# Patient Record
Sex: Male | Born: 1940 | Race: White | Hispanic: No | Marital: Single | Smoking: Current every day smoker
Health system: Southern US, Community
[De-identification: ages and names within clinical notes are randomized; demographics above are authoritative.]

## PROBLEM LIST (undated history)

## (undated) DIAGNOSIS — I219 Acute myocardial infarction, unspecified: Secondary | ICD-10-CM

## (undated) DIAGNOSIS — E7800 Pure hypercholesterolemia, unspecified: Secondary | ICD-10-CM

## (undated) DIAGNOSIS — I639 Cerebral infarction, unspecified: Secondary | ICD-10-CM

## (undated) DIAGNOSIS — R569 Unspecified convulsions: Secondary | ICD-10-CM

## (undated) DIAGNOSIS — H5440 Blindness, one eye, unspecified eye: Secondary | ICD-10-CM

## (undated) DIAGNOSIS — I10 Essential (primary) hypertension: Secondary | ICD-10-CM

## (undated) DIAGNOSIS — I4891 Unspecified atrial fibrillation: Secondary | ICD-10-CM

## (undated) HISTORY — PX: HERNIA REPAIR: SHX51

---

## 2003-09-27 ENCOUNTER — Emergency Department (HOSPITAL_COMMUNITY): Admission: EM | Admit: 2003-09-27 | Discharge: 2003-09-27 | Payer: Self-pay | Admitting: Emergency Medicine

## 2004-09-11 DIAGNOSIS — I639 Cerebral infarction, unspecified: Secondary | ICD-10-CM

## 2004-09-11 HISTORY — DX: Cerebral infarction, unspecified: I63.9

## 2006-11-08 ENCOUNTER — Ambulatory Visit: Payer: Self-pay | Admitting: Cardiology

## 2006-11-08 ENCOUNTER — Observation Stay (HOSPITAL_COMMUNITY): Admission: EM | Admit: 2006-11-08 | Discharge: 2006-11-10 | Payer: Self-pay | Admitting: Emergency Medicine

## 2006-11-09 ENCOUNTER — Encounter: Payer: Self-pay | Admitting: Cardiology

## 2008-09-29 ENCOUNTER — Encounter: Admission: RE | Admit: 2008-09-29 | Discharge: 2008-09-29 | Payer: Self-pay | Admitting: Family Medicine

## 2008-10-16 ENCOUNTER — Ambulatory Visit (HOSPITAL_COMMUNITY): Admission: RE | Admit: 2008-10-16 | Discharge: 2008-10-16 | Payer: Self-pay | Admitting: Neurosurgery

## 2011-01-27 NOTE — Procedures (Signed)
EEG NUMBER:  [id number].   CLINICAL HISTORY:  The patient is a 70 year old with new onset of  seizures.  Study is being done to look for presence of seizure activity,  780.39.   PROCEDURE:  The tracing is carried out on a 32-channel digital Cadwell  recorder reformatted into 16-channel montages with 1 devoted to EKG.  The patient was awake during the recording.  The International 10/20  system of lead placement was used.   DESCRIPTION OF FINDINGS:  Dominant frequency is a 6 to 7 Hz less than 10  microvolt activity that is broadly distributed.  Frontally predominant  beta range activity of under 10 microvolts was also seen.  Toward the  end of the record, an 8 Hz alpha range activity was seen centrally and  occasionally posteriorly but was fragmentary.  There was no focal  slowing.  There was no interictal epileptiform activity in the form of  spikes or sharp waves.   EKG showed an atrial-fibrillation pattern with ventricular response of  60 beats per minute.   IMPRESSION:  Abnormal EEG on the basis of mild diffuse background  slowing.  This could very well be related to postictal state.  Other  etiologies are possible and would need to be correlated with the  patient's clinical context.  There is no evidence of focality or  seizures in this record.      Deanna Artis. Sharene Skeans, M.D.  Electronically Signed     ZOX:WRUE  D:  11/10/2006 12:20:28  T:  11/10/2006 14:49:46  Job #:  [id number]   cc:   Melvyn Novas, M.D.  Fax: [phone]

## 2011-01-27 NOTE — Consult Note (Signed)
NAME:  Kyle [name], Kyle [name] NO.:  [id number]   MEDICAL RECORD NO.:  [id number]          PATIENT TYPE:  INP   LOCATION:  6524                         FACILITY:  [facility]   PHYSICIAN:  Melvyn Novas, M.D.  DATE OF BIRTH:  1941-05-07   DATE OF CONSULTATION:  11/09/2006  DATE OF DISCHARGE:                                 CONSULTATION   HISTORY:  This is a 70 year old Caucasian right-handed male who had  suffered a full generalized tonic-clonic seizure yesterday witnessed by  a family member that stated that it might have lasted as long as 5  minutes.  He became prolonged postictal.  In the ER he received 1 gram  of fosphenytoin.  He never had a second seizure during this  hospitalization but he remained prolonged drowsy.   PAST MEDICAL HISTORY:  His past medical history is positive for history  of atrial fibrillation and leukemia.  The patient is coumadinized.  His  last outside INR was 2.3.  He is followed by a cardiologist in [place]  which I believe he named Dr. Elroy Channel.  He has suffered in 2006 at the time  he was diagnosed with leukemia, a left parietal stroke.  This is seen by  today's CT but it shows no bleed in spite of his coumadinization.  He  also has a history of a MI in 1990, continued coronary artery disease,  mild emphysema.   REVIEW OF SYSTEMS:  He is in no acute distress.  He feels sore and achy.  He has no visible tongue bite.  His speech is clear.  He is amnestic for  the event.  He does still have trouble remembering his doctor's name and  where his medical care took place.   PHYSICAL EVALUATION:  VITAL SIGNS:  Shows stable vital signs.  The  patient has a history of A fib.  He has a blood pressure of 128/76,  heart rate of 88, respiratory rate of 10.  Lungs were clear to  auscultation.  The patient continued Coumadin at 2.5 mg daily for his  atrial fibrillation.  There is also hyperlipidemia and a chest wall Port-  A-Cath for leukemia chemotherapy  treatments noted.  The patient has a  congenital right eye ptosis and an inguinal hernia.  Cranial nerves:  Mental status:  The patient is now alert but he is not  fully oriented.  He is oriented to person and place but has memory  deficits.  He is however pretty well able to concentrate.  He can repeat  three items.  He can count for me, spell, give me his birth date, etc.  Pupils react equal to light.  He has full extraocular movements but he  has a right-sided ptosis that he claims is congenital.  There is no  other facial asymmetry.  No sensory loss.  Tongue and uvula are midline  without any evidence of bite mark.  The neck is supple.  There is no  carotid bruit.  There is a regular pulse now.  Periphery vision is  intact and the patient states that he suffered a  right quadrant anopsia  to the lower quadrant on the right.  By finger perimetry this was  possibly mixed.  He has no extremity weakness but he has a preexisting  increased tone in the right arm.  I did not see a pronator drift and  finger-nose test was intact.  He has equal deep tendon reflexes, no  Babinski.  He sits up without assistance.  Gait was deferred as the  patient is currently receiving IV drugs.   LABORATORY RESULTS:  White blood cell count of 5.4, hemoglobin 12.8,  hematocrit of 37.6 and the patient has a platelet count of 88,000 which  is low.  An INR here was 2.1, prothrombin time was 24, sodium 134,  potassium 4.4 and glucose 121, BUN 15, creatinine 1.28.  SGOT 25, GPT  21.   ASSESSMENT:  New onset seizure in a patient with a history of stroke,  atrial fibrillation and leukemia.   PLAN:  Since there was no bleed by CT and the patient is on Coumadin, I  would like to check an MRI today to rule out a leukemic infiltrate or a  smaller bleed as well as new ischemic changes that could be embolic  given the history of atrial fibrillation.  An EEG is also to follow  today.  I would presume that discharge for  this patient could be taken place  this afternoon or tomorrow morning.  I would prefer to discharge this  patient on Keppra 500 mg b.i.d. unless his oncologist would object.  Follow-up with me [facility] in 4-6 weeks.  Our office number is [phone].  Keppra does not need blood level checked.      Melvyn Novas, M.D.  Electronically Signed     CD/MEDQ  D:  11/09/2006  T:  11/09/2006  Job:  [id number]   cc:   Elliot Cousin, M.D.

## 2011-01-27 NOTE — Discharge Summary (Signed)
NAMERHODERICK, FARREL               ACCOUNT NO.:  [id number]   MEDICAL RECORD NO.:  [id number]          PATIENT TYPE:  INP   LOCATION:  6524                         FACILITY:  [facility]   PHYSICIAN:  Wilson Singer, M.D.DATE OF BIRTH:  1940/11/28   DATE OF ADMISSION:  11/08/2006  DATE OF DISCHARGE:  11/10/2006                               DISCHARGE SUMMARY   FINAL DISCHARGE DIAGNOSES:  1. Seizure.  2. Left brain stroke 2003.  3. Chronic atrial fibrillation on chronic Coumadin therapy.  4. Coronary artery disease with myocardial infarction in 1990.  5. Hyperlipidemia.  6. 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   MEDICATIONS ON DISCHARGE:  1. Keppra 500 mg b.i.d.  2. Coumadin dose per home which is usually 2.5 mg once a day.  3. Toprol XL 25 mg daily.  4. Zocor 40 mg daily.   CONDITION ON DISCHARGE:  Stable.   CONSULTATIONS:  Neurology with Dr. Porfirio Mylar Dohmeier.   PROCEDURES:  MRI of brain which showed a remote left hemispheric stroke,  no acute notable infarct, no arachnoid cysts,  no abnormal enhancements  detected to suggest CNS leukemia, chronic microvascular ischemic change  along with gliosis of the white matter associated with a large previous  infarct.   HISTORY:  This 70 year old man was admitted with a witnessed seizure.  He came into the emergency room and was evaluated there.  He was given  emergent intravenous phenytoin.  Please see initial history and physical  examination by Dr. Elliot Cousin.   HOSPITAL PROGRESS:  The patient was admitted and stabilized from a  seizure point of view, and there were no further seizures after the  seizure that was already witnessed.  He was seen by neurology on  November 09, 2006, and medications were changed, and an EEG was ordered,  the results of which are still pending.  Also, an echocardiogram was  done and revealed atrial fibrillation, and this shows that he is in  atrial fibrillation with an ejection fraction  estimated to be 30 to 40%.  There are findings suggestive of hypokinesis of the anterior and  posterior wall.  Left ventricular wall thickness was notably increased.  There was mild aortic valvular regurgitation.  There was moderate mitral  valve regurgitation also.   He is today in a stable state, and vital signs are normal with no  evidence of any drowsiness, and he has not had any further seizures.  He  is being discharged today, and I  have suggested very strongly that he  follow up with his primary care physician as soon as possible and also  his cardiologist in one to two weeks.  He is also to call Dr. Melvyn Novas, neurology, at [phone] in about four weeks.      Wilson Singer, M.D.  Electronically Signed     NCG/MEDQ  D:  11/10/2006  T:  11/11/2006  Job:  [id number]   cc:   Fara Boros, M.D.
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
## 2013-08-26 ENCOUNTER — Emergency Department (HOSPITAL_COMMUNITY): Payer: Medicare Other

## 2013-08-26 ENCOUNTER — Emergency Department (HOSPITAL_COMMUNITY)
Admission: EM | Admit: 2013-08-26 | Discharge: 2013-08-26 | Disposition: A | Discharge status: Home / Self Care | Payer: Medicare Other | Attending: Emergency Medicine | Admitting: Emergency Medicine

## 2013-08-26 ENCOUNTER — Encounter (HOSPITAL_COMMUNITY): Payer: Self-pay | Admitting: Emergency Medicine

## 2013-08-26 DIAGNOSIS — R569 Unspecified convulsions: Secondary | ICD-10-CM

## 2013-08-26 HISTORY — DX: Essential (primary) hypertension: I10

## 2013-08-26 HISTORY — DX: Cerebral infarction, unspecified: I63.9

## 2013-08-26 HISTORY — DX: Blindness, one eye, unspecified eye: H54.40

## 2013-08-26 HISTORY — DX: Acute myocardial infarction, unspecified: I21.9

## 2013-08-26 HISTORY — DX: Unspecified atrial fibrillation: I48.91

## 2013-08-26 HISTORY — DX: Pure hypercholesterolemia, unspecified: E78.00

## 2013-08-26 HISTORY — DX: Unspecified convulsions: R56.9

## 2013-08-26 LAB — POCT I-STAT, CHEM 8
Calcium, Ion: 1.2 mmol/L (ref 1.13–1.30)
Creatinine, Ser: 1 mg/dL (ref 0.50–1.35)
Glucose, Bld: 149 mg/dL — ABNORMAL HIGH (ref 70–99)
Hemoglobin: 15.6 g/dL (ref 13.0–17.0)
Sodium: 141 mEq/L (ref 135–145)
TCO2: 14 mmol/L (ref 0–100)

## 2013-08-26 LAB — DIFFERENTIAL
Basophils Relative: 0 % (ref 0–1)
Eosinophils Absolute: 0.1 10*3/uL (ref 0.0–0.7)
Eosinophils Relative: 1 % (ref 0–5)
Lymphocytes Relative: 26 % (ref 12–46)
Lymphs Abs: 2.4 10*3/uL (ref 0.7–4.0)
Monocytes Relative: 8 % (ref 3–12)
Neutrophils Relative %: 64 % (ref 43–77)

## 2013-08-26 LAB — CBC
HCT: 44.5 % (ref 39.0–52.0)
Hemoglobin: 15.7 g/dL (ref 13.0–17.0)
MCV: 93.9 fL (ref 78.0–100.0)
RBC: 4.74 MIL/uL (ref 4.22–5.81)
WBC: 9 10*3/uL (ref 4.0–10.5)

## 2013-08-26 LAB — PROTIME-INR
INR: 1.63 — ABNORMAL HIGH (ref 0.00–1.49)
Prothrombin Time: 18.9 seconds — ABNORMAL HIGH (ref 11.6–15.2)

## 2013-08-26 LAB — COMPREHENSIVE METABOLIC PANEL
AST: 28 U/L (ref 0–37)
BUN: 16 mg/dL (ref 6–23)
Glucose, Bld: 148 mg/dL — ABNORMAL HIGH (ref 70–99)
Sodium: 137 mEq/L (ref 135–145)
Total Bilirubin: 0.4 mg/dL (ref 0.3–1.2)
Total Protein: 6.9 g/dL (ref 6.0–8.3)

## 2013-08-26 LAB — APTT: aPTT: 34 seconds (ref 24–37)

## 2013-08-26 MED ORDER — LORAZEPAM 2 MG/ML IJ SOLN
INTRAMUSCULAR | Status: AC
  Filled 2013-08-26: qty 1

## 2013-08-26 MED ORDER — SODIUM CHLORIDE 0.9 % IV SOLN
1000.0000 mg | Freq: Once | INTRAVENOUS | Status: AC
  Administered 2013-08-26: 1000 mg via INTRAVENOUS
  Filled 2013-08-26: qty 10

## 2013-08-26 MED ORDER — LEVETIRACETAM 500 MG PO TABS: 500.0000 mg | ORAL_TABLET | Freq: Two times a day (BID) | ORAL | Status: DC

## 2013-08-26 NOTE — ED Notes (Signed)
Pt transported to CT ?

## 2013-08-26 NOTE — ED Notes (Signed)
Family at bedside. 

## 2013-08-26 NOTE — ED Notes (Signed)
Dr. Kirkpatrick at bedside 

## 2013-08-26 NOTE — ED Notes (Signed)
MD at bedside. 

## 2013-08-26 NOTE — ED Provider Notes (Signed)
CSN: [id number]     Arrival date & time 08/26/13  2034 History   First MD Initiated Contact with Patient 08/26/13 2036     Chief Complaint  Patient presents with  . Code Stroke   (Consider location/radiation/quality/duration/timing/severity/associated sxs/prior Treatment) Patient is a 71 y.o. male presenting with neurologic complaint. The history is provided by the patient.  Neurologic Problem This is a new problem. The current episode started 3 to 5 hours ago. The problem occurs constantly. The problem has been gradually improving. Pertinent negatives include no chest pain, no abdominal pain, no headaches and no shortness of breath. Nothing aggravates the symptoms. Nothing relieves the symptoms. He has tried nothing for the symptoms. The treatment provided no relief.    No past medical history on file. No past surgical history on file. No family history on file. History  Substance Use Topics  . Smoking status: Not on file  . Smokeless tobacco: Not on file  . Alcohol Use: Not on file    Review of Systems  Constitutional: Negative for fever.  HENT: Negative for drooling and rhinorrhea.   Eyes: Negative for pain.  Respiratory: Negative for cough and shortness of breath.   Cardiovascular: Negative for chest pain and leg swelling.  Gastrointestinal: Negative for nausea, vomiting, abdominal pain and diarrhea.  Genitourinary: Negative for dysuria and hematuria.  Musculoskeletal: Negative for gait problem and neck pain.  Skin: Negative for color change.  Neurological: Positive for seizures. Negative for numbness and headaches.  Hematological: Negative for adenopathy.  Psychiatric/Behavioral: Negative for behavioral problems.  All other systems reviewed and are negative.    Allergies  Review of patient's allergies indicates not on file.  Home Medications  No current outpatient prescriptions on file. BP 120/66  Pulse 98  Temp(Src) 98.4 F (36.9 C) (Oral)  Resp 15  SpO2  94% Physical Exam  Nursing note and vitals reviewed. Constitutional: He appears well-developed and well-nourished.  HENT:  Head: Normocephalic and atraumatic.  Right Ear: External ear normal.  Left Ear: External ear normal.  Nose: Nose normal.  Mouth/Throat: Oropharynx is clear and moist. No oropharyngeal exudate.  Eyes: Conjunctivae and EOM are normal. Pupils are equal, round, and reactive to light.  Neck: Normal range of motion. Neck supple.  Cardiovascular: Normal rate, regular rhythm, normal heart sounds and intact distal pulses.  Exam reveals no gallop and no friction rub.   No murmur heard. Pulmonary/Chest: Effort normal and breath sounds normal. No respiratory distress. He has no wheezes.  Abdominal: Soft. Bowel sounds are normal. He exhibits no distension. There is no tenderness. There is no rebound and no guarding.  Musculoskeletal: Normal range of motion. He exhibits no edema and no tenderness.  Neurological: He is alert.  Garbled speech. Can answer some questions correctly.   A/o x1.   Difficult exam as he will not always follow commands.  No obvious motor/sensory deficit.   Skin: Skin is warm and dry.  Psychiatric: He has a normal mood and affect. His behavior is normal.    ED Course  Procedures (including critical care time) Labs Review Labs Reviewed  PROTIME-INR - Abnormal; Notable for the following:    Prothrombin Time 18.9 (*)    INR 1.63 (*)    All other components within normal limits  CBC - Abnormal; Notable for the following:    Platelets 109 (*)    All other components within normal limits  COMPREHENSIVE METABOLIC PANEL - Abnormal; Notable for the following:    CO2 10 (*)  Glucose, Bld 148 (*)    GFR calc non Af Amer 81 (*)    All other components within normal limits  URINALYSIS, ROUTINE W REFLEX MICROSCOPIC - Abnormal; Notable for the following:    Protein, ur 30 (*)    All other components within normal limits  GLUCOSE, CAPILLARY - Abnormal;  Notable for the following:    Glucose-Capillary 143 (*)    All other components within normal limits  POCT I-STAT, CHEM 8 - Abnormal; Notable for the following:    Glucose, Bld 149 (*)    All other components within normal limits  ETHANOL  APTT  DIFFERENTIAL  TROPONIN I  URINE RAPID DRUG SCREEN (HOSP PERFORMED)  URINE MICROSCOPIC-ADD ON  POCT I-STAT TROPONIN I   Imaging Review Ct Head Wo Contrast  08/26/2013   CLINICAL DATA:  Code stroke, right arm weakness this evening, history stroke  EXAM: CT HEAD WITHOUT CONTRAST  TECHNIQUE: Contiguous axial images were obtained from the base of the skull through the vertex without intravenous contrast.  COMPARISON:  04/15/2008  FINDINGS: Motion artifacts limit exam.  Generalized atrophy.  Stable ventricular morphology.  No midline shift or mass effect.  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  No definite intracranial hemorrhage, mass lesion or evidence acute infarction.  No extra-axial fluid collections identified.  Bones and sinuses grossly unremarkable.  IMPRESSION: Generalized atrophy.  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  No definite acute intracranial abnormalities identified on exam limited by motion.  Findings called to Dr. Amada Jupiter on 08/26/2013 at 2115 hr.   Electronically Signed   By: Ulyses Southward M.D.   On: 08/26/2013 21:16   Mr Brain Wo Contrast  08/26/2013   CLINICAL DATA:  Right arm weakness. Right facial droop. Inability to speak. History of high blood pressure and hypercholesterolemia. Prior stroke. History of seizures  EXAM: MRI HEAD WITHOUT CONTRAST  TECHNIQUE: Multiplanar, multiecho pulse sequences of the brain and surrounding structures were obtained without intravenous contrast.  COMPARISON:  08/26/2013 CT.  11/09/1998 and MR.  FINDINGS: No acute infarct.  Remote large posterior left temporal -parietal lobe infarct with encephalomalacia.  Small vessel disease type changes.  Global atrophy without hydrocephalus.  No intracranial hemorrhage.   Small anterior right middle cranial fossa arachnoid cyst stable otherwise no evidence of intracranial mass lesion noted on this unenhanced exam.  Small right vertebral artery as previously noted may be congenitally small. Major intracranial vascular structures are patent at the level of the sella.  Transverse ligament hypertrophy. C3-4 disc disease with spinal stenosis and cord flattening. This is incompletely assessed on present exam.  Cervical medullary junction, pituitary region and pineal region unremarkable.  Cystic appearing structure right lacrimal duct measures up to 1.2 cm and previously measured up to 6 mm in 2008. Restricted motion at this level. Etiology indeterminate. This may represent result of blockage of drainage of the lacrimal gland.  Mild paranasal sinus mucosal thickening.  IMPRESSION: No acute infarct.  Remote large posterior left temporal -parietal lobe infarct with encephalomalacia.  Small vessel disease type changes.  Global atrophy without hydrocephalus.  No intracranial hemorrhage.  Small anterior right middle cranial fossa arachnoid cyst stable otherwise no evidence of intracranial mass lesion noted on this unenhanced exam.  Small right vertebral artery as previously noted may be congenitally small. Major intracranial vascular structures are patent at the level of the sella.  Transverse ligament hypertrophy. C3-4 disc disease with spinal stenosis and cord flattening. This is incompletely assessed on present exam.  Cystic appearing structure right lacrimal  duct measures up to 1.2 cm and previously measured up to 6 mm in 2008. Restricted motion at this level. Etiology indeterminate. This may represent result of blockage of drainage of the lacrimal gland.   Electronically Signed   By: Bridgett Larsson M.D.   On: 08/26/2013 22:22    EKG Interpretation    Date/Time:  Tuesday August 26 2013 20:54:58 EST Ventricular Rate:  94 PR Interval:    QRS Duration: 136 QT Interval:  414 QTC  Calculation: 518 R Axis:   55 Text Interpretation:  Atrial fibrillation Right bundle branch block Inferior infarct, age indeterminate Baseline wander in lead(s) II aVR V5 No significant change since last tracing Confirmed by Mckennon Zwart  MD, Rithvik Orcutt ([id number]) on 08/26/2013 11:28:24 PM            MDM   1. Seizure    9:11 PM 72 y.o. male w hx of seizures who presents as a code stroke. His son reports that at approximately 6 PM this evening he began having garbled speech. It also appeared like he had some right-sided weakness as well. He had some jerking movements of his arm at home. In route he had a seizure lasting approximately 1.5 minutes, it appeared to be diffuse tonic-clonic clonic. The patient is afebrile and vital signs are unremarkable here. He continues to have garbled speech on exam. His motor symptoms seem to be resolving.  Neurology has seen the patient. I discussed the case w/ Dr. Amada Jupiter, if MRI neg and pt back to baseline, it would be reasonable to send him home as his sx were likely related to a seizure.   11:24 PM: Pt now a/o x3, ambulatory, and interacting appropriately. No focal motor/sensory deficits noted. Speaking clearly on exam. Will send home w/ Rx for keppra. He got an IV dose here.  I discussed the options of observation vs discharge w/ him and his sons. They are happy w/ d/c home.  I have discussed the diagnosis/risks/treatment options with the patient and family and believe the pt to be eligible for discharge home to follow-up with neurology as an outpt. We also discussed returning to the ED immediately if new or worsening sx occur. We discussed the sx which are most concerning (e.g., further seizures, fever, vomiting, weakness, numbness) that necessitate immediate return. Any new prescriptions provided to the patient are listed below.  Discharge Medication List as of 08/26/2013 11:26 PM    START taking these medications   Details  levETIRAcetam (KEPPRA) 500 MG  tablet Take 1 tablet (500 mg total) by mouth 2 (two) times daily., Starting 08/26/2013, Until Discontinued, Print         Junius Argyle, MD 08/27/13 1300

## 2013-08-26 NOTE — ED Notes (Signed)
Pt returned from MRI °

## 2013-08-26 NOTE — ED Notes (Signed)
Per EMS: pt coming from home. Pt stated to wife feeling weak around 1800, upon EMS arrival pt had right arm weakness, right side facial droop, and unable to speak, pt ambulated to stretcher with steady gait, pt has emesis x 1 post 90 sec grand mal seziure witness by EMS. Pt was given 4 mg zofran. Pt is post-ictle, incontinent of urine, disoriented x 4.

## 2013-08-26 NOTE — Consult Note (Signed)
Neurology Consultation Reason for Consult: Right-sided we Referring Physician: Romeo Apple, F.  CC: Right-sided weakness  History is obtained from: Family  HPI: Kyle [name] is a 72 y.o. male with a history of stroke as well as previous seizure who presents with right-sided weakness and aphasia. He was watching TV with the son when his son noticed that he clenched his right fist, and subsequently was having difficulty getting his words out and moving his right arm. EMS was called because of this, and en route he had a two-minute long convulsive seizure. On arrival, he had significant right arm weakness as well as aphasia which steadily improved after admission.   LKW: 6 PM tpa given?: no, seizure at onset, did not get INR back until after 3 hours following onset of symptoms    ROS: A 14 point ROS was performed and is negative except as noted in the HPI.  Past Medical History  Diagnosis Date  . A-fib   . Seizures   . Stroke   . Hypertension   . Hypercholesteremia   . Blind right eye   . MI (myocardial infarction)     Family History: No History of seizure  Social History: Tob: Unable to obtain due to AMS  Exam: Current vital signs: BP 125/85  Pulse 95  Temp(Src) 98.4 F (36.9 C) (Oral)  Resp 22  SpO2 98% Vital signs in last 24 hours: Temp:  [98.4 F (36.9 C)] 98.4 F (36.9 C) (12/16 2100) Pulse Rate:  [94-98] 95 (12/16 2245) Resp:  [15-22] 22 (12/16 2245) BP: (120-128)/(66-85) 125/85 mmHg (12/16 2245) SpO2:  [94 %-100 %] 98 % (12/16 2245) FiO2 (%):  [28 %] 28 % (12/16 2035)  General: In bed, NAD CV: Regular rate and Mental Status: Patient is awake, alert, unable to answer any questions or follow commands initially, subsequently began following commands and speaking more. No signs of neglect Cranial Nerves: II: Appears to blink to threat from both sides Pupils are equal, round, and reactive to light.  Discs are difficult to visualize. III,IV, VI: EOMI without  ptosis or diploplia.  V: Facial sensation is unclear due to AMS VII: Facial movement is decreased on right VIII, X, XI, XII: Unable to assess secondary to patient's altered mental status.  Motor: Tone is normal. Bulk is normal. 5/5 strength was present on the left, appear to move right leg well, 4/5 weakness of the right upper extremity Sensory: Response to noxious stimulation in all 4 extremities Deep Tendon Reflexes: 2+ and symmetric in the biceps and patellae.  Cerebellar: Unable to assess secondary to patient's altered mental status.  Gait: Unable to assess secondary to patient's altered mental status.     I have reviewed labs in epic and the results pertinent to this consultation are: Mildly elevated glucose INR 1.6  I have reviewed the images obtained: MRI brain-no acute infarct  Impression: 72 year old male who likely had a partial seizure at onset of symptoms, subsequently generalized. He has a history of seizure a year after a previous stroke. With his continued rapid improvement, I strongly suspect that seizure activity with Todd's paralysis is responsible for his presentation tonight.  Recommendations: 1) Keppra 1 g followed by 500 mg twice a day 2) patient does have a subtherapeutic Coumadin level, would consider increasing this.   Ritta Slot, MD [facility] [phone]  If 7pm- 7am, please page neurology on call at [phone].

## 2013-08-26 NOTE — ED Notes (Signed)
Transported to MRI

## 2013-08-27 LAB — RAPID URINE DRUG SCREEN, HOSP PERFORMED
Amphetamines: NOT DETECTED
Barbiturates: NOT DETECTED
Benzodiazepines: NOT DETECTED
Cocaine: NOT DETECTED
Tetrahydrocannabinol: NOT DETECTED

## 2013-08-27 LAB — URINALYSIS, ROUTINE W REFLEX MICROSCOPIC
Glucose, UA: NEGATIVE mg/dL
Hgb urine dipstick: NEGATIVE
Leukocytes, UA: NEGATIVE
Nitrite: NEGATIVE
Specific Gravity, Urine: 1.02 (ref 1.005–1.030)

## 2013-08-27 LAB — URINE MICROSCOPIC-ADD ON

## 2013-09-01 ENCOUNTER — Encounter: Payer: Self-pay | Admitting: Neurology

## 2013-09-01 ENCOUNTER — Ambulatory Visit (INDEPENDENT_AMBULATORY_CARE_PROVIDER_SITE_OTHER): Payer: Medicare Other | Admitting: Neurology

## 2013-09-01 VITALS — BP 132/72 | HR 58 | Temp 97.4°F | Resp 14 | Ht 72.0 in | Wt 198.6 lb

## 2013-09-01 DIAGNOSIS — G40909 Epilepsy, unspecified, not intractable, without status epilepticus: Secondary | ICD-10-CM

## 2013-09-01 DIAGNOSIS — Z8673 Personal history of transient ischemic attack (TIA), and cerebral infarction without residual deficits: Secondary | ICD-10-CM | POA: Insufficient documentation

## 2013-09-01 MED ORDER — LEVETIRACETAM 500 MG PO TABS: 500.0000 mg | ORAL_TABLET | Freq: Two times a day (BID) | ORAL | Status: DC

## 2013-09-01 NOTE — Progress Notes (Signed)
[facility] Neurology [facility] Clinic Note - Initial Visit   Date: 09/01/2013    Kyle [name] MRN: [id number] DOB: [birth year]   Dear Dr Clarene Duke:  Thank you for your kind referral of Kyle [name] for consultation of seizures. Although his history is well known to you, please allow [place] to reiterate it for the purpose of our medical record. The patient was accompanied to the clinic by his wife.   History of Present Illness: Kyle [name] is a 72 y.o. right-handed Caucasian male with history of atrial fibrillation (on coumadin), embolic L parietotemporal infarct (2008, residual mild expression aphasia), hypertension, hyperlipidemia, congenital blindness of right eye, leukemia (2006), and CAD with prior myocardial infarction presenting for evaluation of seizures.    On 12/16, he was watching TV with the son when his son noticed that he clenched his right fist and had difficulty getting words out.  He then started jerking the right arm, so EMS was called.  En route he had a two-minute GTC seizure with loss of consciousness and developed Todd's paralysis.  He was started on Keppra 500mg  BID.  By the time of discharge, right sided strength has returned and he was back to baseline.  He had a stroke in 2006 which left which left him with mild expressive aphasia.  Mechanism was embolic due to atrial fibrillation and he has been on coumadin since then.  He reports having one seizure in 2008 characterized a GTC, lasting 5-minutes with prolonged post-ictal state.  He received dilantin 1g and was discharged on Keppra 500mg  BID, but had stopped it soon after due to being lost to follow-up to neurology.    Out-side paper records, electronic medical record, and images have been reviewed where available and summarized as:  MRI brain 08/26/2013 No acute infarct.  Remote large posterior left temporal -parietal lobe infarct with encephalomalacia.  Small vessel disease type changes.  Global atrophy  without hydrocephalus.  No intracranial hemorrhage.  Small anterior right middle cranial fossa arachnoid cyst stable otherwise no evidence of intracranial mass lesion noted on this unenhanced exam.  Small right vertebral artery as previously noted may be congenitally small. Major intracranial vascular structures are patent at the level of the sella.  Transverse ligament hypertrophy. C3-4 disc disease with spinal stenosis and cord flattening. This is incompletely assessed on present exam.  Cystic appearing structure right lacrimal duct measures up to 1.2 cm and previously measured up to 6 mm in 2008. Restricted motion at this level. Etiology indeterminate. This may represent result of blockage of drainage of the lacrimal gland.   EEG 11/10/2007:  Abnormal EEG on the basis of mild diffuse background slowing. This could very well be related to postictal state. Kyle etiologies are possible and would need to be correlated with thepatient's clinical context. There is no evidence of focality or seizures in this record   Past Medical History  Diagnosis Date  . A-fib   . Seizures   . Stroke   . Hypertension   . Hypercholesteremia   . Blind right eye   . MI (myocardial infarction)     History reviewed. No pertinent past surgical history.   Medications:  Current Outpatient Prescriptions on File Prior to Visit  Medication Sig Dispense Refill  . diltiazem (TIAZAC) 120 MG 24 hr capsule Take 120 mg by mouth daily.      Marland Kitchen levETIRAcetam (KEPPRA) 500 MG tablet Take 1 tablet (500 mg total) by mouth 2 (two) times daily.  60 tablet  1  .  simvastatin (ZOCOR) 40 MG tablet Take 40 mg by mouth daily.      . theophylline (THEO-24) 300 MG 24 hr capsule Take 300 mg by mouth daily.      . Vitamin D, Ergocalciferol, (DRISDOL) 50000 UNITS CAPS capsule Take 50,000 Units by mouth every 7 (seven) days.      Marland Kitchen warfarin (COUMADIN) 2 MG tablet Take 2 mg by mouth daily.       No current facility-administered medications  on file prior to visit.    Allergies: No Known Allergies  Family History: History reviewed. No pertinent family history.  Social History: History   Social History  . Marital Status: Single    Spouse Name: N/A    Number of Children: N/A  . Years of Education: N/A   Occupational History  . Not on file.   Social History Main Topics  . Smoking status: Current Every Day Smoker  . Smokeless tobacco: Not on file  . Alcohol Use: No  . Drug Use: Not on file  . Sexual Activity: Not on file   Kyle Topics Concern  . Not on file   Social History Narrative  . No narrative on file    Review of Systems:  CONSTITUTIONAL: No fevers, chills, night sweats, or weight loss.   EYES: No visual changes or eye pain ENT: No hearing changes.  No history of nose bleeds.   RESPIRATORY: No cough, wheezing and shortness of breath.   CARDIOVASCULAR: Negative for chest pain, and palpitations.   GI: Negative for abdominal discomfort, blood in stools or black stools.  No recent change in bowel habits.   GU:  No history of incontinence.   MUSCLOSKELETAL: No history of joint pain or swelling.  No myalgias.   SKIN: Negative for lesions, rash, and itching.   HEMATOLOGY/ONCOLOGY: Negative for prolonged bleeding, bruising easily, and swollen nodes.  +history of cancer.   ENDOCRINE: Negative for cold or heat intolerance, polydipsia or goiter.   PSYCH:  No depression or anxiety symptoms.   NEURO: As Above.   Vital Signs:  BP 132/72  Pulse 58  Temp(Src) 97.4 F (36.3 C)  Resp 14  Ht 6' (1.829 m)  Wt 198 lb 9.6 oz (90.084 kg)  BMI 26.93 kg/m2 Pain Scale: 0 on a scale of 0-10   General Medical Exam:   General:  Well appearing, comfortable.   Eyes/ENT: see cranial nerve examination.   Neck: No masses appreciated.  Full range of motion without tenderness.   Respiratory:  Clear to auscultation, good air entry bilaterally.   Cardiac:  Irregularly irregular.   Extremities:  No deformities, edema, or  skin discoloration. Good capillary refill.   Skin:  Seborrheic keratosis noted.   Neurological Exam: MENTAL STATUS including orientation to time, place, person, recent and remote memory, attention span and concentration, language, and fund of knowledge is normal.  Speech is not dysarthric.  CRANIAL NERVES: II:  No visual field defects on the left eye.  Blind in the right eye.  Unremarkable fundi on the left, right eye has no red reflex.   III-IV-VI: Left surgical pupil, but reactive to light.  Right esotropia mild impaired downward gaze, extraocular movements of the left eye is intact.  No nystagmus.  No ptosis    V:  Normal facial sensation.    VII:  Normal facial symmetry and movements.  VIII:  Normal hearing and vestibular function.   IX-X:  Normal palatal movement.   XI:  Normal shoulder shrug and head  rotation.   XII:  Normal tongue strength and range of motion, no deviation or fasciculation.  MOTOR:  No atrophy, fasciculations or abnormal movements.  No pronator drift.  Tone is normal.    Right Upper Extremity:    Left Upper Extremity:    Deltoid  5/5   Deltoid  5/5   Biceps  5/5   Biceps  5/5   Triceps  5/5   Triceps  5/5   Wrist extensors  5/5   Wrist extensors  5/5   Wrist flexors  5/5   Wrist flexors  5/5   Finger extensors  5/5   Finger extensors  5/5   Finger flexors  5/5   Finger flexors  5/5   Dorsal interossei  5/5   Dorsal interossei  5/5   Abductor pollicis  5/5   Abductor pollicis  5/5   Tone (Ashworth scale)  0  Tone (Ashworth scale)  0   Right Lower Extremity:    Left Lower Extremity:    Hip flexors  5/5   Hip flexors  5/5   Hip extensors  5/5   Hip extensors  5/5   Knee flexors  5/5   Knee flexors  5/5   Knee extensors  5/5   Knee extensors  5/5   Dorsiflexors  5/5   Dorsiflexors  5/5   Plantarflexors  5/5   Plantarflexors  5/5   Toe extensors  5/5   Toe extensors  5/5   Toe flexors  5/5   Toe flexors  5/5   Tone (Ashworth scale)  0  Tone (Ashworth scale)   0   MSRs:  Right                                                                 Left brachioradialis 3+  brachioradialis 2+  biceps 3+  biceps 2+  triceps 2+  triceps 2+  patellar 2+  patellar 2+  ankle jerk 0  ankle jerk 0  Hoffman no  Hoffman no  plantar response down  plantar response down   SENSORY:  Normal and symmetric perception of light touch, pinprick, vibration, and proprioception.  Romberg's sign absent.   COORDINATION/GAIT: Normal finger-to- nose-finger and heel-to-shin.  Mild imprecision with finger tapping on the right.  Able to rise from a chair without using arms.  Gait narrow based and stable. Tandem and stressed gait intact.    IMPRESSION/PLAN: 1.  Seizure disorder  - Known structural brain lesion - left parieto-temporal encephalomalacia   - Started on keppra 500mg  BID during his recent admission, recommend continuing this dose  - Extensive discussion on seizure precautions, including no driving was discussed at length.   - Warning signs of seizure was discussed  2.  History of ischemic stroke, mechanism embolic due to atrial fibrillation  - Non-focal exam  - Continue coumadin  - Continue simvastatin 40mg   - Encouraged tobacco cessation 3.  Return to clinic in 4-months, or sooner as needed   The duration of this appointment visit was 50 minutes of face-to-face time with the patient.  Greater than 50% of this time was spent in counseling, explanation of diagnosis, planning of further management, and coordination of care.   Thank you for allowing me to participate in patient's  care.  If I can answer any additional questions, I would be pleased to do so.    Sincerely,    Donika K. Posey Pronto, DO

## 2013-09-01 NOTE — Patient Instructions (Signed)
1.  Continue Keppra 500mg  twice daily 2.  Return to clinic in 37-months   SEIZURE PRECAUTIONS  Bathroom Safety  A person with seizures may want to shower instead of bathe to avoid accidental drowning. If falls occur during the patient's typical seizure, a person should use a shower seat, preferably one with a safety strap.    Use nonskid strips in your shower or tub.    Never use electrical equipment near water. This prevents accidental electrocution.    Consider changing glass in shower doors to shatterproof glass.   Secondary school teacher   If possible, cook when someone else is nearby.    Use the back burners of the stove to prevent accidental burns.    Use shatterproof containers as much as possible. For instance, sauces can be transferred from glass bottles to plastic containers for use.    Limit time that is required using knives or other sharp objects. If possible, buy foods that are already cut, or ask someone to help in meal preparation.   General Safety at Home   Do not smoke or light fires in the fireplace unless someone else is present.    Do not use space heaters that can be accidentally overturned.    When alone, avoid using step stools or ladders, and do not clean rooftop gutters.    Purchase power tools and motorized Risk manager which have a safety switch that will stop the machine if you release the handle (a 'dead man's' switch).   Driving and Transportation   Avoid driving unless your seizures are well controlled and/or you have permission to drive from your state's Department of Motor Vehicles  [facility]). Each state has different laws. Please refer to the following link on the Epilepsy Foundation of America's website for more information: http://www.epilepsyfoundation.org/answerplace/Social/driving/drivingu.cfm    If you ride a bicycle, wear a helmet and any other necessary protective gear.    When taking public transportation like the bus or subway, stay clear of the platform  edge.   Outdoor Product/process development scientist is okay, but does present certain risks. Never swim alone, and tell friends what to do if you have a seizure while swimming.    Wear appropriate protective equipment.    Ski with a friend. If a seizure occurs, your friend can seek help, if needed. He or she can also help to get you out of the cold. Consider using a safety hook or belt while riding the ski lift.

## 2013-11-03 ENCOUNTER — Telehealth: Payer: Self-pay | Admitting: Neurology

## 2013-11-03 ENCOUNTER — Ambulatory Visit: Payer: Medicare Other | Admitting: Neurology

## 2013-11-03 NOTE — Telephone Encounter (Signed)
Pt no showed follow up sch for today w/ Dr. Allena KatzPatel. No show letter mailed to pt / Sherri

## 2013-11-03 NOTE — Telephone Encounter (Signed)
Noted.  Donika K. Patel, DO   

## 2013-12-01 ENCOUNTER — Encounter: Payer: Self-pay | Admitting: Neurology

## 2013-12-01 ENCOUNTER — Ambulatory Visit (INDEPENDENT_AMBULATORY_CARE_PROVIDER_SITE_OTHER): Payer: Medicare Other | Admitting: Neurology

## 2013-12-01 VITALS — BP 114/68 | HR 60 | Wt 194.1 lb

## 2013-12-01 DIAGNOSIS — Z8673 Personal history of transient ischemic attack (TIA), and cerebral infarction without residual deficits: Secondary | ICD-10-CM

## 2013-12-01 DIAGNOSIS — G40909 Epilepsy, unspecified, not intractable, without status epilepticus: Secondary | ICD-10-CM

## 2013-12-01 MED ORDER — LEVETIRACETAM 500 MG PO TABS
500.0000 mg | ORAL_TABLET | Freq: Two times a day (BID) | ORAL | Status: AC
Start: 2013-12-01 — End: ?

## 2013-12-01 NOTE — Progress Notes (Signed)
Follow-up Visit   Date: 12/01/2013    Kyle [name] MRN: [id number] DOB: Feb 13, 1941   Interim History: Kyle [name] is a 73 y.o. right-handed Caucasian male with history of history of atrial fibrillation (on coumadin), embolic L parietotemporal infarct (2008, residual mild expression aphasia), hypertension, hyperlipidemia, congenital blindness of right eye, leukemia (2006), and CAD returning to the clinic for follow-up of seizure disorder.  The patient was accompanied to the clinic by wife who also provides collateral information.  He is here with his wife.  History of present illness: On 12/16, he was watching TV with the son when his son noticed that he clenched his right fist and had difficulty getting words out. He then started jerking the right arm, so EMS was called. En route he had a two-minute GTC seizure with loss of consciousness and developed Todd's paralysis. He was started on Keppra 500mg  BID. By the time of discharge, right sided strength has returned and he was back to baseline.   He had a stroke in 2006 which left which left him with mild expressive aphasia. Mechanism was embolic due to atrial fibrillation and he has been on coumadin since then. He reports having one seizure in 2008 characterized a GTC, lasting 5-minutes with prolonged post-ictal state. He received dilantin 1g and was discharged on Keppra 500mg  BID, but had stopped it soon after due to being lost to follow-up to neurology.  - Follow-up 12/01/2013:  He has no new symptoms today.  No interval seizures.  He had a skin lesion removed from his forehead last week, but otherwise been well.   Medications:  Current Outpatient Prescriptions on File Prior to Visit  Medication Sig Dispense Refill  . diltiazem (TIAZAC) 120 MG 24 hr capsule Take 120 mg by mouth daily.      Marland Kitchen levETIRAcetam (KEPPRA) 500 MG tablet Take 1 tablet (500 mg total) by mouth 2 (two) times daily.  60 tablet  3  . simvastatin (ZOCOR) 40 MG tablet  Take 40 mg by mouth daily.      . theophylline (THEO-24) 300 MG 24 hr capsule Take 300 mg by mouth daily.      . vitamin B-12 (CYANOCOBALAMIN) 500 MCG tablet Take 500 mcg by mouth daily.      . Vitamin D, Ergocalciferol, (DRISDOL) 50000 UNITS CAPS capsule Take 50,000 Units by mouth every 7 (seven) days.      Marland Kitchen warfarin (COUMADIN) 2 MG tablet Take 2 mg by mouth daily.       No current facility-administered medications on file prior to visit.    Allergies: No Known Allergies   Review of Systems:  CONSTITUTIONAL: No fevers, chills, night sweats, or weight loss.   EYES: No visual changes or eye pain ENT: No hearing changes.  No history of nose bleeds.   RESPIRATORY: No cough, wheezing and shortness of breath.   CARDIOVASCULAR: Negative for chest pain, and palpitations.   GI: Negative for abdominal discomfort, blood in stools or black stools.  No recent change in bowel habits.   GU:  No history of incontinence.   MUSCLOSKELETAL: No history of joint pain or swelling.  No myalgias.   SKIN: Negative for lesions, rash, and itching.   ENDOCRINE: Negative for cold or heat intolerance, polydipsia or goiter.   PSYCH:  No depression or anxiety symptoms.   NEURO: As Above.   Vital Signs:  BP 114/68  Pulse 60  Wt 194 lb 2 oz (88.055 kg)  SpO2 93%  Neurological Exam:  MENTAL STATUS including orientation to time, place, person, recent and remote memory, attention span and concentration, language, and fund of knowledge is normal.  Speech is not dysarthric.  CRANIAL NERVES: Right eye is blind (old), able to see light only.  Left pupil is surgical, but reactive to light.  Right lateral rectus palsy, otherwise normal extra-ocular muscles.  Visual fields intact on left.  Right mild ptosis (old). Normal facial sensation.  Face is symmetric. Palate elevates symmetrically.  Tongue is midline.  MOTOR:  Motor strength is 5/5 in all extremities.  No atrophy, fasciculations or abnormal movements.  No  pronator drift.  Tone is normal.    MSRs:   Right      Left  brachioradialis  3+   brachioradialis  2+   biceps  3+   biceps  2+   triceps  2+   triceps  2+   patellar  2+   patellar  2+   ankle jerk  0   ankle jerk  0   Hoffman  no   Hoffman  no   plantar response  down   plantar response  down    SENSORY:  Intact to light touch and vibration.  COORDINATION/GAIT:  Normal finger-to- nose-finger and heel-to-shin. Mild imprecision with finger tapping on the right.  Gait narrow based and stable.   Data: MRI brain 08/26/2013  No acute infarct.  Remote large posterior left temporal -parietal lobe infarct with encephalomalacia.  Small vessel disease type changes.  Global atrophy without hydrocephalus.  No intracranial hemorrhage.  Small anterior right middle cranial fossa arachnoid cyst stable otherwise no evidence of intracranial mass lesion noted on this unenhanced exam.  Small right vertebral artery as previously noted may be congenitally small. Major intracranial vascular structures are patent at the level of the sella.  Transverse ligament hypertrophy. C3-4 disc disease with spinal stenosis and cord flattening. This is incompletely assessed on present exam.  Cystic appearing structure right lacrimal duct measures up to 1.2 cm and previously measured up to 6 mm in 2008. Restricted motion at this level. Etiology indeterminate. This may represent result of blockage of drainage of the lacrimal gland.   EEG 11/10/2007: Abnormal EEG on the basis of mild diffuse background slowing. This could very well be related to postictal state. Other etiologies are possible and would need to be correlated with thepatient's clinical context. There is no evidence of focality or seizures in this record      IMPRESSION/PLAN: 1. Seizure disorder   - Known structural brain lesion - left parieto-temporal encephalomalacia   - Continue keppra 500mg  BID.  Refills provided for one year.  - Extensive discussion  on seizure precautions, including no driving.   - Warning signs of seizure was discussed  2. History of ischemic stroke, mechanism embolic due to atrial fibrillation   - Non-focal exam   - Continue coumadin   - Continue simvastatin 40mg    - Encouraged tobacco cessation  3. Return to clinic in 283-months at which time driving restriction can be reassessed since it will be 86-months from last seizure.   The duration of this appointment visit was 20 minutes of face-to-face time with the patient.  Greater than 50% of this time was spent in counseling, explanation of diagnosis, planning of further management, and coordination of care.   Thank you for allowing me to participate in patient's care.  If I can answer any additional questions, I would be pleased to do so.    Sincerely,  Travas Schexnayder K. Iness Pangilinan, DO    

## 2013-12-01 NOTE — Patient Instructions (Signed)
1.  Continue Keppra 500mg  twice daily.  Refills have been sent to your pharmacy. 2.  Seizure precautions discussed, including no driving 3.  Return to clinic in 43-months (after June 16th)  SEIZURE PRECAUTIONS  Bathroom Safety  A person with seizures may want to shower instead of bathe to avoid accidental drowning. If falls occur during the patient's typical seizure, a person should use a shower seat, preferably one with a safety strap.    Use nonskid strips in your shower or tub.    Never use electrical equipment near water. This prevents accidental electrocution.    Consider changing glass in shower doors to shatterproof glass.   Secondary school teacherKitchen Safety   If possible, cook when someone else is nearby.    Use the back burners of the stove to prevent accidental burns.    Use shatterproof containers as much as possible. For instance, sauces can be transferred from glass bottles to plastic containers for use.    Limit time that is required using knives or other sharp objects. If possible, buy foods that are already cut, or ask someone to help in meal preparation.   General Safety at Home   Do not smoke or light fires in the fireplace unless someone else is present.    Do not use space heaters that can be accidentally overturned.    When alone, avoid using step stools or ladders, and do not clean rooftop gutters.    Purchase power tools and motorized Risk managerlawn equipment which have a safety switch that will stop the machine if you release the handle (a 'dead man's' switch).   Driving and Transportation   Avoid driving unless your seizures are well controlled and/or you have permission to drive from your state's Department of Motor Vehicles  [facility]). Each state has different laws. Please refer to the following link on the Epilepsy Foundation of America's website for more information: http://www.epilepsyfoundation.org/answerplace/Social/driving/drivingu.cfm    If you ride a bicycle, wear a helmet and any other  necessary protective gear.    When taking public transportation like the bus or subway, stay clear of the platform edge.   Outdoor Product/process development scientistand Sports Safety   Swimming is okay, but does present certain risks. Never swim alone, and tell friends what to do if you have a seizure while swimming.    Wear appropriate protective equipment.    Ski with a friend. If a seizure occurs, your friend can seek help, if needed. He or she can also help to get you out of the cold. Consider using a safety hook or belt while riding the ski lift.

## 2014-03-03 ENCOUNTER — Ambulatory Visit: Payer: Medicare Other | Admitting: Neurology

## 2014-03-04 ENCOUNTER — Emergency Department (HOSPITAL_COMMUNITY): Payer: Medicare Other

## 2014-03-04 ENCOUNTER — Encounter (HOSPITAL_COMMUNITY): Admission: EM | Disposition: E | Payer: Self-pay | Source: Home / Self Care | Attending: Pulmonary Disease

## 2014-03-04 ENCOUNTER — Inpatient Hospital Stay (HOSPITAL_COMMUNITY)
Admission: EM | Admit: 2014-03-04 | Discharge: 2014-04-11 | DRG: 286 | Disposition: E | Payer: Medicare Other | Attending: Pulmonary Disease | Admitting: Pulmonary Disease

## 2014-03-04 ENCOUNTER — Ambulatory Visit (HOSPITAL_COMMUNITY): Admit: 2014-03-04 | Payer: Self-pay | Admitting: Cardiology

## 2014-03-04 ENCOUNTER — Encounter (HOSPITAL_COMMUNITY): Payer: Self-pay | Admitting: Emergency Medicine

## 2014-03-04 DIAGNOSIS — I469 Cardiac arrest, cause unspecified: Secondary | ICD-10-CM | POA: Diagnosis present

## 2014-03-04 DIAGNOSIS — I4510 Unspecified right bundle-branch block: Secondary | ICD-10-CM | POA: Diagnosis present

## 2014-03-04 DIAGNOSIS — I25119 Atherosclerotic heart disease of native coronary artery with unspecified angina pectoris: Secondary | ICD-10-CM

## 2014-03-04 DIAGNOSIS — D696 Thrombocytopenia, unspecified: Secondary | ICD-10-CM | POA: Diagnosis present

## 2014-03-04 DIAGNOSIS — I4892 Unspecified atrial flutter: Secondary | ICD-10-CM | POA: Diagnosis not present

## 2014-03-04 DIAGNOSIS — I4891 Unspecified atrial fibrillation: Secondary | ICD-10-CM | POA: Diagnosis present

## 2014-03-04 DIAGNOSIS — R791 Abnormal coagulation profile: Secondary | ICD-10-CM | POA: Diagnosis present

## 2014-03-04 DIAGNOSIS — E872 Acidosis, unspecified: Secondary | ICD-10-CM | POA: Diagnosis present

## 2014-03-04 DIAGNOSIS — H5440 Blindness, one eye, unspecified eye: Secondary | ICD-10-CM | POA: Diagnosis present

## 2014-03-04 DIAGNOSIS — R55 Syncope and collapse: Secondary | ICD-10-CM | POA: Diagnosis present

## 2014-03-04 DIAGNOSIS — Y92009 Unspecified place in unspecified non-institutional (private) residence as the place of occurrence of the external cause: Secondary | ICD-10-CM

## 2014-03-04 DIAGNOSIS — E8779 Other fluid overload: Secondary | ICD-10-CM | POA: Diagnosis not present

## 2014-03-04 DIAGNOSIS — J9601 Acute respiratory failure with hypoxia: Secondary | ICD-10-CM

## 2014-03-04 DIAGNOSIS — J189 Pneumonia, unspecified organism: Secondary | ICD-10-CM | POA: Diagnosis not present

## 2014-03-04 DIAGNOSIS — I059 Rheumatic mitral valve disease, unspecified: Secondary | ICD-10-CM | POA: Diagnosis present

## 2014-03-04 DIAGNOSIS — I4729 Other ventricular tachycardia: Secondary | ICD-10-CM | POA: Diagnosis not present

## 2014-03-04 DIAGNOSIS — E7800 Pure hypercholesterolemia, unspecified: Secondary | ICD-10-CM | POA: Diagnosis present

## 2014-03-04 DIAGNOSIS — I359 Nonrheumatic aortic valve disorder, unspecified: Secondary | ICD-10-CM | POA: Diagnosis present

## 2014-03-04 DIAGNOSIS — I498 Other specified cardiac arrhythmias: Secondary | ICD-10-CM | POA: Diagnosis present

## 2014-03-04 DIAGNOSIS — G931 Anoxic brain damage, not elsewhere classified: Secondary | ICD-10-CM | POA: Diagnosis present

## 2014-03-04 DIAGNOSIS — R911 Solitary pulmonary nodule: Secondary | ICD-10-CM | POA: Diagnosis present

## 2014-03-04 DIAGNOSIS — I472 Ventricular tachycardia, unspecified: Secondary | ICD-10-CM | POA: Diagnosis not present

## 2014-03-04 DIAGNOSIS — I2589 Other forms of chronic ischemic heart disease: Secondary | ICD-10-CM | POA: Diagnosis present

## 2014-03-04 DIAGNOSIS — I480 Paroxysmal atrial fibrillation: Secondary | ICD-10-CM

## 2014-03-04 DIAGNOSIS — I4901 Ventricular fibrillation: Principal | ICD-10-CM | POA: Diagnosis present

## 2014-03-04 DIAGNOSIS — IMO0002 Reserved for concepts with insufficient information to code with codable children: Secondary | ICD-10-CM | POA: Diagnosis not present

## 2014-03-04 DIAGNOSIS — J150 Pneumonia due to Klebsiella pneumoniae: Secondary | ICD-10-CM | POA: Diagnosis not present

## 2014-03-04 DIAGNOSIS — I482 Chronic atrial fibrillation, unspecified: Secondary | ICD-10-CM

## 2014-03-04 DIAGNOSIS — R569 Unspecified convulsions: Secondary | ICD-10-CM | POA: Diagnosis present

## 2014-03-04 DIAGNOSIS — S2249XA Multiple fractures of ribs, unspecified side, initial encounter for closed fracture: Secondary | ICD-10-CM | POA: Diagnosis present

## 2014-03-04 DIAGNOSIS — I255 Ischemic cardiomyopathy: Secondary | ICD-10-CM

## 2014-03-04 DIAGNOSIS — J449 Chronic obstructive pulmonary disease, unspecified: Secondary | ICD-10-CM | POA: Diagnosis present

## 2014-03-04 DIAGNOSIS — Y849 Medical procedure, unspecified as the cause of abnormal reaction of the patient, or of later complication, without mention of misadventure at the time of the procedure: Secondary | ICD-10-CM | POA: Diagnosis present

## 2014-03-04 DIAGNOSIS — Z66 Do not resuscitate: Secondary | ICD-10-CM | POA: Diagnosis not present

## 2014-03-04 DIAGNOSIS — I252 Old myocardial infarction: Secondary | ICD-10-CM

## 2014-03-04 DIAGNOSIS — Z515 Encounter for palliative care: Secondary | ICD-10-CM

## 2014-03-04 DIAGNOSIS — R7309 Other abnormal glucose: Secondary | ICD-10-CM | POA: Diagnosis not present

## 2014-03-04 DIAGNOSIS — G40802 Other epilepsy, not intractable, without status epilepticus: Secondary | ICD-10-CM | POA: Diagnosis present

## 2014-03-04 DIAGNOSIS — Z7901 Long term (current) use of anticoagulants: Secondary | ICD-10-CM

## 2014-03-04 DIAGNOSIS — Z8673 Personal history of transient ischemic attack (TIA), and cerebral infarction without residual deficits: Secondary | ICD-10-CM | POA: Diagnosis not present

## 2014-03-04 DIAGNOSIS — Z8249 Family history of ischemic heart disease and other diseases of the circulatory system: Secondary | ICD-10-CM | POA: Diagnosis not present

## 2014-03-04 DIAGNOSIS — I2510 Atherosclerotic heart disease of native coronary artery without angina pectoris: Secondary | ICD-10-CM | POA: Diagnosis present

## 2014-03-04 DIAGNOSIS — H269 Unspecified cataract: Secondary | ICD-10-CM | POA: Diagnosis present

## 2014-03-04 DIAGNOSIS — F17200 Nicotine dependence, unspecified, uncomplicated: Secondary | ICD-10-CM | POA: Diagnosis present

## 2014-03-04 DIAGNOSIS — J9600 Acute respiratory failure, unspecified whether with hypoxia or hypercapnia: Secondary | ICD-10-CM | POA: Diagnosis present

## 2014-03-04 DIAGNOSIS — J4489 Other specified chronic obstructive pulmonary disease: Secondary | ICD-10-CM | POA: Diagnosis present

## 2014-03-04 DIAGNOSIS — I25110 Atherosclerotic heart disease of native coronary artery with unstable angina pectoris: Secondary | ICD-10-CM

## 2014-03-04 DIAGNOSIS — I10 Essential (primary) hypertension: Secondary | ICD-10-CM | POA: Diagnosis present

## 2014-03-04 HISTORY — PX: LEFT HEART CATH: SHX5478

## 2014-03-04 LAB — URINALYSIS, ROUTINE W REFLEX MICROSCOPIC
Bilirubin Urine: NEGATIVE
GLUCOSE, UA: 250 mg/dL — AB
KETONES UR: NEGATIVE mg/dL
Leukocytes, UA: NEGATIVE
Nitrite: NEGATIVE
Specific Gravity, Urine: 1.024 (ref 1.005–1.030)
UROBILINOGEN UA: 1 mg/dL (ref 0.0–1.0)
pH: 5 (ref 5.0–8.0)

## 2014-03-04 LAB — PROTIME-INR
INR: 1.83 — AB (ref 0.00–1.49)
Prothrombin Time: 21.2 seconds — ABNORMAL HIGH (ref 11.6–15.2)

## 2014-03-04 LAB — COMPREHENSIVE METABOLIC PANEL
ALBUMIN: 3.4 g/dL — AB (ref 3.5–5.2)
ALT: 59 U/L — ABNORMAL HIGH (ref 0–53)
AST: 70 U/L — ABNORMAL HIGH (ref 0–37)
Alkaline Phosphatase: 69 U/L (ref 39–117)
BUN: 19 mg/dL (ref 6–23)
CHLORIDE: 103 meq/L (ref 96–112)
CO2: 20 mEq/L (ref 19–32)
CREATININE: 1.11 mg/dL (ref 0.50–1.35)
Calcium: 8.3 mg/dL — ABNORMAL LOW (ref 8.4–10.5)
GFR calc Af Amer: 73 mL/min — ABNORMAL LOW (ref >90)
GFR calc non Af Amer: 63 mL/min — ABNORMAL LOW (ref >90)
Glucose, Bld: 262 mg/dL — ABNORMAL HIGH (ref 70–99)
POTASSIUM: 3.8 meq/L (ref 3.7–5.3)
Sodium: 140 mEq/L (ref 137–147)
TOTAL PROTEIN: 5.4 g/dL — AB (ref 6.0–8.3)
Total Bilirubin: 0.6 mg/dL (ref 0.3–1.2)

## 2014-03-04 LAB — CBC
HCT: 38.7 % — ABNORMAL LOW (ref 39.0–52.0)
HEMOGLOBIN: 13.9 g/dL (ref 13.0–17.0)
MCH: 33.7 pg (ref 26.0–34.0)
MCHC: 35.9 g/dL (ref 30.0–36.0)
MCV: 93.9 fL (ref 78.0–100.0)
Platelets: 59 10*3/uL — ABNORMAL LOW (ref 150–400)
RBC: 4.12 MIL/uL — ABNORMAL LOW (ref 4.22–5.81)
RDW: 13.9 % (ref 11.5–15.5)
WBC: 5.5 10*3/uL (ref 4.0–10.5)

## 2014-03-04 LAB — CBG MONITORING, ED: Glucose-Capillary: 214 mg/dL — ABNORMAL HIGH (ref 70–99)

## 2014-03-04 LAB — URINE MICROSCOPIC-ADD ON

## 2014-03-04 LAB — I-STAT ARTERIAL BLOOD GAS, ED
Acid-base deficit: 4 mmol/L — ABNORMAL HIGH (ref 0.0–2.0)
BICARBONATE: 24.2 meq/L — AB (ref 20.0–24.0)
O2 Saturation: 100 %
PH ART: 7.218 — AB (ref 7.350–7.450)
Patient temperature: 98.6
TCO2: 26 mmol/L (ref 0–100)
pCO2 arterial: 59.5 mmHg (ref 35.0–45.0)
pO2, Arterial: 449 mmHg — ABNORMAL HIGH (ref 80.0–100.0)

## 2014-03-04 LAB — TROPONIN I

## 2014-03-04 LAB — APTT: APTT: 35 s (ref 24–37)

## 2014-03-04 LAB — LACTIC ACID, PLASMA: LACTIC ACID, VENOUS: 3.6 mmol/L — AB (ref 0.5–2.2)

## 2014-03-04 SURGERY — LEFT HEART CATH
Anesthesia: LOCAL

## 2014-03-04 MED ORDER — HYDRALAZINE HCL 20 MG/ML IJ SOLN
10.0000 mg | INTRAMUSCULAR | Status: DC | PRN
  Administered 2014-03-07: 20 mg via INTRAVENOUS
  Administered 2014-03-08: 10 mg via INTRAVENOUS
  Filled 2014-03-04: qty 1

## 2014-03-04 MED ORDER — CISATRACURIUM BOLUS VIA INFUSION
0.0500 mg/kg | INTRAVENOUS | Status: DC | PRN
  Filled 2014-03-04: qty 5

## 2014-03-04 MED ORDER — HEPARIN (PORCINE) IN NACL 2-0.9 UNIT/ML-% IJ SOLN
INTRAMUSCULAR | Status: AC
  Filled 2014-03-04: qty 1500

## 2014-03-04 MED ORDER — FENTANYL CITRATE 0.05 MG/ML IJ SOLN
50.0000 ug | Freq: Once | INTRAMUSCULAR | Status: AC
  Administered 2014-03-04: 50 ug via INTRAVENOUS
  Administered 2014-03-04: 20:00:00 via INTRAVENOUS

## 2014-03-04 MED ORDER — IOHEXOL 350 MG/ML SOLN: 100.0000 mL | Freq: Once | INTRAVENOUS | Status: AC | PRN

## 2014-03-04 MED ORDER — MIDAZOLAM HCL 5 MG/ML IJ SOLN: 2.0000 mg | Freq: Once | INTRAMUSCULAR | Status: DC

## 2014-03-04 MED ORDER — SODIUM CHLORIDE 0.9 % IV SOLN
1.0000 mg/h | INTRAVENOUS | Status: DC
  Administered 2014-03-04: 2 mg/h via INTRAVENOUS
  Administered 2014-03-05 – 2014-03-06 (×2): 3 mg/h via INTRAVENOUS
  Filled 2014-03-04 (×4): qty 10

## 2014-03-04 MED ORDER — SODIUM CHLORIDE 0.9 % IV SOLN: 2000.0000 mL | Freq: Once | INTRAVENOUS | Status: DC

## 2014-03-04 MED ORDER — SODIUM CHLORIDE 0.9 % IV SOLN
2000.0000 mL | Freq: Once | INTRAVENOUS | Status: AC
  Administered 2014-03-04: 2000 mL via INTRAVENOUS

## 2014-03-04 MED ORDER — ASPIRIN 300 MG RE SUPP
300.0000 mg | RECTAL | Status: AC
Start: 2014-03-04 — End: 2014-03-04
  Administered 2014-03-04: 300 mg via RECTAL
  Filled 2014-03-04: qty 1

## 2014-03-04 MED ORDER — ALBUTEROL SULFATE (2.5 MG/3ML) 0.083% IN NEBU: 2.5000 mg | INHALATION_SOLUTION | RESPIRATORY_TRACT | Status: DC | PRN

## 2014-03-04 MED ORDER — FENTANYL CITRATE 0.05 MG/ML IJ SOLN
INTRAMUSCULAR | Status: AC
  Filled 2014-03-04: qty 2

## 2014-03-04 MED ORDER — ARTIFICIAL TEARS OP OINT
1.0000 "application " | TOPICAL_OINTMENT | Freq: Three times a day (TID) | OPHTHALMIC | Status: DC
  Administered 2014-03-05 – 2014-03-06 (×4): 1 via OPHTHALMIC
  Filled 2014-03-04: qty 3.5

## 2014-03-04 MED ORDER — FENTANYL CITRATE 0.05 MG/ML IJ SOLN
100.0000 ug | Freq: Once | INTRAMUSCULAR | Status: AC
  Administered 2014-03-04: 50 ug via INTRAVENOUS

## 2014-03-04 MED ORDER — SODIUM CHLORIDE 0.9 % IV SOLN
1.0000 ug/kg/min | INTRAVENOUS | Status: DC
  Administered 2014-03-04: 1 ug/kg/min via INTRAVENOUS
  Filled 2014-03-04 (×2): qty 20

## 2014-03-04 MED ORDER — CISATRACURIUM BOLUS VIA INFUSION
0.1000 mg/kg | Freq: Once | INTRAVENOUS | Status: AC
  Administered 2014-03-04: 8.8 mg via INTRAVENOUS
  Filled 2014-03-04: qty 9

## 2014-03-04 MED ORDER — MIDAZOLAM BOLUS VIA INFUSION
2.0000 mg | INTRAVENOUS | Status: DC | PRN
  Administered 2014-03-04 – 2014-03-07 (×2): 2 mg via INTRAVENOUS
  Filled 2014-03-04: qty 2

## 2014-03-04 MED ORDER — ASPIRIN 300 MG RE SUPP
300.0000 mg | RECTAL | Status: AC
  Administered 2014-03-05: 300 mg via RECTAL
  Filled 2014-03-04: qty 1

## 2014-03-04 MED ORDER — FENTANYL CITRATE 0.05 MG/ML IJ SOLN: 100.0000 ug | Freq: Once | INTRAMUSCULAR | Status: AC | PRN

## 2014-03-04 MED ORDER — VERAPAMIL HCL 2.5 MG/ML IV SOLN
INTRAVENOUS | Status: AC
  Filled 2014-03-04: qty 2

## 2014-03-04 MED ORDER — IPRATROPIUM-ALBUTEROL 0.5-2.5 (3) MG/3ML IN SOLN
3.0000 mL | RESPIRATORY_TRACT | Status: DC
  Administered 2014-03-05 – 2014-03-06 (×9): 3 mL via RESPIRATORY_TRACT
  Filled 2014-03-04 (×9): qty 3

## 2014-03-04 MED ORDER — HEPARIN SODIUM (PORCINE) 1000 UNIT/ML IJ SOLN
INTRAMUSCULAR | Status: AC
  Filled 2014-03-04: qty 1

## 2014-03-04 MED ORDER — FENTANYL BOLUS VIA INFUSION
50.0000 ug | INTRAVENOUS | Status: DC | PRN
  Administered 2014-03-04: 50 ug via INTRAVENOUS
  Filled 2014-03-04: qty 50

## 2014-03-04 MED ORDER — IOHEXOL 350 MG/ML SOLN
100.0000 mL | Freq: Once | INTRAVENOUS | Status: AC | PRN
  Administered 2014-03-04: 100 mL via INTRAVENOUS

## 2014-03-04 MED ORDER — FENTANYL CITRATE 0.05 MG/ML IJ SOLN
25.0000 ug/h | INTRAMUSCULAR | Status: DC
  Administered 2014-03-04 – 2014-03-05 (×2): 75 ug/h via INTRAVENOUS
  Filled 2014-03-04 (×2): qty 50

## 2014-03-04 MED ORDER — MIDAZOLAM HCL 5 MG/ML IJ SOLN: 2.0000 mg | Freq: Once | INTRAMUSCULAR | Status: AC | PRN

## 2014-03-04 MED ORDER — NITROGLYCERIN 0.2 MG/ML ON CALL CATH LAB
INTRAVENOUS | Status: AC
  Filled 2014-03-04: qty 1

## 2014-03-04 MED ORDER — LIDOCAINE HCL (PF) 1 % IJ SOLN
INTRAMUSCULAR | Status: AC
  Filled 2014-03-04: qty 30

## 2014-03-04 MED ORDER — NOREPINEPHRINE BITARTRATE 1 MG/ML IV SOLN
0.5000 ug/min | INTRAVENOUS | Status: DC
  Administered 2014-03-05: 10 ug/min via INTRAVENOUS
  Filled 2014-03-04 (×4): qty 4

## 2014-03-04 MED ORDER — SODIUM CHLORIDE 0.9 % IV SOLN
Freq: Once | INTRAVENOUS | Status: AC
  Administered 2014-03-04: 20:00:00 via INTRAVENOUS

## 2014-03-04 NOTE — ED Provider Notes (Signed)
CSN: [id number]     Arrival date & time 03-26-14  1922 History   First MD Initiated Contact with Patient 2014/03/26 1929     Chief Complaint  Patient presents with  . Cardiac Arrest     (Consider location/radiation/quality/duration/timing/severity/associated sxs/prior Treatment) Patient is a 73 y.o. male presenting with syncope. The history is provided by the EMS personnel.  Loss of Consciousness Episode history:  Single Most recent episode:  Today Timing:  Constant Chronicity:  New Context: not blood draw, not exertion, not with normal activity, not standing up and not urination   Witnessed: yes   Relieved by: defibrillation and amiodarone. Associated symptoms: no anxiety, no confusion, no nausea, no palpitations, no recent surgery and no rectal bleeding     Past Medical History  Diagnosis Date  . A-fib   . Seizures   . Stroke 2006  . Hypertension   . Hypercholesteremia   . Blind right eye   . MI (myocardial infarction)    Past Surgical History  Procedure Laterality Date  . Hernia repair     Family History  Problem Relation Age of Onset  . Heart disease Mother     Died, 1  . Parkinson's disease Mother   . Heart disease Father     Died, 29  . Healthy Son    History  Substance Use Topics  . Smoking status: Current Every Day Smoker    Types: Cigars  . Smokeless tobacco: Not on file     Comment: 2-5 cigars x 30 years  . Alcohol Use: No    Review of Systems  Unable to perform ROS: Intubated  Cardiovascular: Positive for syncope. Negative for palpitations.  Gastrointestinal: Negative for nausea.  Psychiatric/Behavioral: Negative for confusion.      Allergies  Review of patient's allergies indicates no known allergies.  Home Medications   Prior to Admission medications   Medication Sig Start Date End Date Taking? Authorizing Provider  diltiazem (TIAZAC) 120 MG 24 hr capsule Take 120 mg by mouth daily.    Historical Provider, MD  levETIRAcetam (KEPPRA)  500 MG tablet Take 1 tablet (500 mg total) by mouth 2 (two) times daily. 12/01/13   Glendale Chard, MD  simvastatin (ZOCOR) 40 MG tablet Take 40 mg by mouth daily.    Historical Provider, MD  theophylline (THEO-24) 300 MG 24 hr capsule Take 300 mg by mouth daily.    Historical Provider, MD  vitamin B-12 (CYANOCOBALAMIN) 500 MCG tablet Take 500 mcg by mouth daily.    Historical Provider, MD  Vitamin D, Ergocalciferol, (DRISDOL) 50000 UNITS CAPS capsule Take 50,000 Units by mouth every 7 (seven) days.    Historical Provider, MD  warfarin (COUMADIN) 2 MG tablet Take 2 mg by mouth daily.    Historical Provider, MD   BP 173/98  Pulse 79  Temp(Src) 95.5 F (35.3 C) (Core (Comment))  Resp 18  Ht 6' (1.829 m)  Wt 194 lb (87.998 kg)  BMI 26.31 kg/m2  SpO2 100% Physical Exam  Nursing note and vitals reviewed. Constitutional: He appears well-developed and well-nourished.  HENT:  Head: Normocephalic and atraumatic.  Eyes: Conjunctivae are normal. Pupils are equal, round, and reactive to light.  Cardiovascular: Normal rate and regular rhythm.   Pulmonary/Chest: No respiratory distress. He has no wheezes.  Abdominal: Soft. He exhibits no distension.  Musculoskeletal: Normal range of motion. He exhibits no edema.  Skin: He is not diaphoretic. No erythema.    ED Course  CENTRAL LINE Date/Time: 03-26-14  9:00 PM Performed by: Marily Memos Authorized by: Purvis Sheffield, S Consent: The procedure was performed in an emergent situation. Time out: Immediately prior to procedure a "time out" was called to verify the correct patient, procedure, equipment, support staff and site/side marked as required. Indications: vascular access Patient sedated: no Preparation: skin prepped with 2% chlorhexidine Skin prep agent dried: skin prep agent completely dried prior to procedure Sterile barriers: all five maximum sterile barriers used - cap, mask, sterile gown, sterile gloves, and large sterile  sheet Hand hygiene: hand hygiene performed prior to central venous catheter insertion Location details: left internal jugular Site selection rationale: clean, port on right Patient position: flat Catheter type: triple lumen Pre-procedure: landmarks identified Ultrasound guidance: yes Number of attempts: 1 Successful placement: yes Post-procedure: line sutured and dressing applied Assessment: blood return through all ports,  free fluid flow,  placement verified by x-ray and no pneumothorax on x-ray Patient tolerance: Patient tolerated the procedure well with no immediate complications.   (including critical care time) Labs Review Labs Reviewed  CBC - Abnormal; Notable for the following:    RBC 4.12 (*)    HCT 38.7 (*)    Platelets 59 (*)    All other components within normal limits  PROTIME-INR - Abnormal; Notable for the following:    Prothrombin Time 21.2 (*)    INR 1.83 (*)    All other components within normal limits  COMPREHENSIVE METABOLIC PANEL - Abnormal; Notable for the following:    Glucose, Bld 262 (*)    Calcium 8.3 (*)    Total Protein 5.4 (*)    Albumin 3.4 (*)    AST 70 (*)    ALT 59 (*)    GFR calc non Af Amer 63 (*)    GFR calc Af Amer 73 (*)    All other components within normal limits  LACTIC ACID, PLASMA - Abnormal; Notable for the following:    Lactic Acid, Venous 3.6 (*)    All other components within normal limits  URINALYSIS, ROUTINE W REFLEX MICROSCOPIC - Abnormal; Notable for the following:    Color, Urine AMBER (*)    APPearance TURBID (*)    Glucose, UA 250 (*)    Hgb urine dipstick LARGE (*)    Protein, ur >300 (*)    All other components within normal limits  CBG MONITORING, ED - Abnormal; Notable for the following:    Glucose-Capillary 214 (*)    All other components within normal limits  I-STAT ARTERIAL BLOOD GAS, ED - Abnormal; Notable for the following:    pH, Arterial 7.218 (*)    pCO2 arterial 59.5 (*)    pO2, Arterial 449.0 (*)     Bicarbonate 24.2 (*)    Acid-base deficit 4.0 (*)    All other components within normal limits  URINE CULTURE  APTT  TROPONIN I  URINE MICROSCOPIC-ADD ON  BLOOD GAS, ARTERIAL  BLOOD GAS, ARTERIAL    Imaging Review Ct Head Wo Contrast  2014-03-14   CLINICAL DATA:  Cardiac arrest.  Query intracranial sequela.  EXAM: CT HEAD WITHOUT CONTRAST  TECHNIQUE: Contiguous axial images were obtained from the base of the skull through the vertex without intravenous contrast.  COMPARISON:  08/26/2013  FINDINGS: Remote left posterior MCA distribution infarct. Distribution not changed from 08/26/2013. Adjacent white matter hypodensity also similar to prior.  The brainstem, cerebellum, cerebral peduncle is, thalami, and basal ganglia appear intact. Ventricular system unremarkable.  Periventricular white matter and corona radiata hypodensities  favor chronic ischemic microvascular white matter disease. No intracranial hemorrhage, mass lesion, or acute CVA.  Chronic bilateral maxillary and ethmoid sinusitis.  Stable right lacrimal duct enlargement.  IMPRESSION: 1. No acute intracranial findings. 2. Remote left posterior MCA distribution infarct. Periventricular white matter and corona radiata hypodensities favor chronic ischemic microvascular white matter disease. 3. Chronic bilateral maxillary and ethmoid sinusitis. 4. Stable right lacrimal duct enlargement.   Electronically Signed   By: Herbie BaltimoreWalt  Liebkemann M.D.   On: 08/03/14 21:43   Ct Angio Chest Pe W/cm &/or Wo Cm  09-21-13   CLINICAL DATA:  Cardiac arrest.  EXAM: CT ANGIOGRAPHY CHEST WITH CONTRAST  TECHNIQUE: Multidetector CT imaging of the chest was performed using the standard protocol during bolus administration of intravenous contrast. Multiplanar CT image reconstructions and MIPs were obtained to evaluate the vascular anatomy.  CONTRAST:  100mL OMNIPAQUE IOHEXOL 350 MG/ML SOLN  COMPARISON:  Chest x-ray dated 08/03/14  FINDINGS: There are no pulmonary  emboli. There is a 10 x 9 x 8 mm pulmonary nodule at the anterior aspect of the right hilum in the right upper lobe worrisome for primary lung malignancy.  There are tiny bilateral pleural effusions with slight atelectasis at the lung bases. There is cardiomegaly with marked thinning of the inferior wall of the left ventricle consistent with prior myocardial infarction.  There are multiple anterior rib fractures, possibly secondary to CPR.  No pneumothorax. There is slight peribronchial thickening particularly in the lower lobes. There is a 14 mm lymph node to the left of the trachea just above the carina as well as a 16 mm lymph node in the aortopulmonary window.  Review of the MIP images confirms the above findings.  IMPRESSION: 1. No pulmonary emboli. 2. 10 mm nodule in the right upper lobe which may represent a primary carcinoma long. 3. Cardiomegaly with marked thinning of the inferior wall of the left ventricle consistent with prior myocardial infarction. 4. Multiple bilateral anterior rib fractures.   Electronically Signed   By: Geanie CooleyJim  Maxwell M.D.   On: 08/03/14 21:49   Dg Chest Port 1 View  09-21-13   CLINICAL DATA:  Central line placement, intubated  EXAM: PORTABLE CHEST - 1 VIEW  COMPARISON:  None.  FINDINGS: Cardiomediastinal silhouette is unremarkable. There is poor inspiration. No acute infiltrate or pulmonary edema. Endotracheal tube in place with tip 5.5 cm above the carinal. No pneumothorax. There is right Port-A-Cath with tip in right atrium. NG tube in place. Left IJ central line with tip in SVC.  IMPRESSION: Poor inspiration. No pneumothorax. No segmental infiltrate or pulmonary edema. Right Port-A-Cath with tip in right atrium. Endotracheal tube in place. NG tube in place. Left IJ central line with tip in SVC.   Electronically Signed   By: Natasha MeadLiviu  Pop M.D.   On: 08/03/14 20:24     EKG Interpretation   Date/Time:  Wednesday March 04 2014 19:47:54 EDT Ventricular Rate:  75 PR  Interval:    QRS Duration: 152 QT Interval:  462 QTC Calculation: 516 R Axis:   119 Text Interpretation:  Atrial fibrillation LVH by voltage Prolonged QT  interval Confirmed by HARRISON  MD, FORREST ([id number]) on 09-21-13 10:37:38  PM      MDM   Final diagnoses:  Ventricular fibrillation  Cardiac arrest    73 yo M presents after being witnessed vfib arrest with chest compressions started within 2 minutes of down time. FD showed up around 10 minutes and patient was in vfib, shocked  once and given amiodarone with ROSC. Intubated in field, stable since then. On exam here, bilateral breath sounds, no evidence of trauma, in afib with strong pulses. ECG with afib, no STEMI. 2/2 vfib arrest, hypothermia protocol initiated and line placed as above. Had difficulty speaking with cardiology and ccm 2/2 dead batteries and missed calls however ultimate decision to take patient to cath lab for witnessed vfib arrest.     Marily MemosJason Mesner, MD 03/05/14 1118

## 2014-03-04 NOTE — Consult Note (Signed)
CARDIOLOGY CONSULT NOTE  Patient ID: Kyle [name], MRN: [id number], DOB/AGE: 75/10/1940 75 y.o. Admit date: 02/16/2014 Date of Consult: 02/26/2014  Primary Physician: Kyle PufferLITTLE,JAMES, MD Primary Cardiologist: [facility] is at [facility]  Chief Complaint: vfib arrest Reason for Consultation: same  HPI: 73 y.o. male w/ PMHx significant for CAD remote MI in the 1990s, h/o stroke, h/o seizure d/o, atrial fibrillation, HTN  who [facility]2014 after vfib arrest today at 6:20 pm. Witnessed by family members who stated that after dinner, while walking to the living room, he appeared to pass out. Family was nearby and witnessed the event and were able to catch him before he hit the floor. Laid him on the couch and he had agonal sound breathing. Called 911 and started bystander CPR. EMS arrived approx 10 minutes later, continued CPR and was found to be in vfib --> defibrillated with a single shock. Given amiodarone 300 mg.  Family reports that approx 2 weeks ago, had syncopal event while eating dinner. Lasted 3-4 minutes and he woke up spontaneously and felt good enough to eat dinner and 911 was canceled before EMS arrived. Family reports that he had been having episodic diaphoresis which he attributed to the heat (though he is stoic). Some discussion at some point regarding the need for a pacemaker (details not clear). See cardi[facility], name not known.  In the ER, was intubated and sedated. Left IJ placed. Has been hemodynamically stable, BPs in the 140s, no arrhythmias on telemetry.  In discussion with critical care and interventionalist on call, the patient is a good candidate for early catheterization. This is pending at this point.    Past Medical History  Diagnosis Date  . A-fib   . Seizures   . Stroke 2006  . Hypertension   . Hypercholesteremia   . Blind right eye   . MI (myocardial infarction)       Surgical History:  Past Surgical History   Procedure Laterality Date  . Hernia repair       Home Meds: Prior to Admission medications   Medication Sig Start Date End Date Taking? Authorizing Provider  diltiazem (TIAZAC) 120 MG 24 hr capsule Take 120 mg by mouth daily.    Historical Provider, MD  levETIRAcetam (KEPPRA) 500 MG tablet Take 1 tablet (500 mg total) by mouth 2 (two) times daily. 12/01/13   Kyle Chardonika K Patel, MD  simvastatin (ZOCOR) 40 MG tablet Take 40 mg by mouth daily.    Historical Provider, MD  theophylline (THEO-24) 300 MG 24 hr capsule Take 300 mg by mouth daily.    Historical Provider, MD  vitamin B-12 (CYANOCOBALAMIN) 500 MCG tablet Take 500 mcg by mouth daily.    Historical Provider, MD  Vitamin D, Ergocalciferol, (DRISDOL) 50000 UNITS CAPS capsule Take 50,000 Units by mouth every 7 (seven) days.    Historical Provider, MD  warfarin (COUMADIN) 2 MG tablet Take 2 mg by mouth daily.    Historical Provider, MD    Inpatient Medications:  . midazolam  2 mg Intravenous Once   . cisatracurium (NIMBEX) infusion 1.2 mcg/kg/min (02/16/2014 2041)   And  . cisatracurium    . fentaNYL infusion INTRAVENOUS 75 mcg/hr (02/28/2014 2010)  . midazolam (VERSED) infusion 2 mg/hr (02/14/2014 2010)  . midazolam    . norepinephrine (LEVOPHED) Adult infusion      Allergies: No Known Allergies  History   Social History  . Marital Status: Single    Spouse Name: N/A  Number of Children: N/A  . Years of Education: N/A   Occupational History  . Not on file.   Social History Main Topics  . Smoking status: Current Every Day Smoker    Types: Cigars  . Smokeless tobacco: Not on file     Comment: 2-5 cigars x 30 years  . Alcohol Use: No  . Drug Use: No  . Sexual Activity: Not on file   Other Topics Concern  . Not on file   Social History Narrative   Lives with wife. They have three grown sons.   Retired Naval architect.     Family History  Problem Relation Age of Onset  . Heart disease Mother     Died, 62  . Parkinson's  disease Mother   . Heart disease Father     Died, 22  . Healthy Son      Review of Systems: Pt intubated and sedated.  Labs:  Recent Labs  02/24/2014 1949  TROPONINI <0.30   Lab Results  Component Value Date   WBC 5.5    HGB 13.9 02/14/2014   HCT 38.7* 02/11/2014   MCV 93.9 02/14/2014   PLT 59* 02/20/2014    Recent Labs Lab 02/10/2014 1949  NA 140  K 3.8  CL 103  CO2 20  BUN 19  CREATININE 1.11  CALCIUM 8.3*  PROT 5.4*  BILITOT 0.6  ALKPHOS 69  ALT 59*  AST 70*  GLUCOSE 262*   No results found for this basename: CHOL, HDL, LDLCALC, TRIG   No results found for this basename: DDIMER    Radiology/Studies:  Ct Head Wo Contrast  03/10/2014   CLINICAL DATA:  Cardiac arrest.  Query intracranial sequela.  EXAM: CT HEAD WITHOUT CONTRAST  TECHNIQUE: Contiguous axial images were obtained from the base of the skull through the vertex without intravenous contrast.  COMPARISON:  08/26/2013  FINDINGS: Remote left posterior MCA distribution infarct. Distribution not changed from 08/26/2013. Adjacent white matter hypodensity also similar to prior.  The brainstem, cerebellum, cerebral peduncle is, thalami, and basal ganglia appear intact. Ventricular system unremarkable.  Periventricular white matter and corona radiata hypodensities favor chronic ischemic microvascular white matter disease. No intracranial hemorrhage, mass lesion, or acute CVA.  Chronic bilateral maxillary and ethmoid sinusitis.  Stable right lacrimal duct enlargement.  IMPRESSION: 1. No acute intracranial findings. 2. Remote left posterior MCA distribution infarct. Periventricular white matter and corona radiata hypodensities favor chronic ischemic microvascular white matter disease. 3. Chronic bilateral maxillary and ethmoid sinusitis. 4. Stable right lacrimal duct enlargement.   Electronically Signed   By: Kyle [name] M.D.   On: 02/23/2014 21:43   Ct Angio Chest Pe W/cm &/or Wo Cm  02/27/2014   CLINICAL  DATA:  Cardiac arrest.  EXAM: CT ANGIOGRAPHY CHEST WITH CONTRAST  TECHNIQUE: Multidetector CT imaging of the chest was performed using the standard protocol during bolus administration of intravenous contrast. Multiplanar CT image reconstructions and MIPs were obtained to evaluate the vascular anatomy.  CONTRAST:  OMNIPAQUE IOHEXOL 350 MG/ML SOLN  COMPARISON:  Chest x-ray dated 03/08/2014  FINDINGS: There are no pulmonary emboli. There is a 10 x 9 x 8 mm pulmonary nodule at the anterior aspect of the right hilum in the right upper lobe worrisome for primary lung malignancy.  There are tiny bilateral pleural effusions with slight atelectasis at the lung bases. There is cardiomegaly with marked thinning of the inferior wall of the left ventricle consistent with prior myocardial infarction.  There are  multiple anterior rib fractures, possibly secondary to CPR.  No pneumothorax. There is slight peribronchial thickening particularly in the lower lobes. There is a 14 mm lymph node to the left of the trachea just above the carina as well as a 16 mm lymph node in the aortopulmonary window.  Review of the MIP images confirms the above findings.  IMPRESSION: 1. No pulmonary emboli. 2. 10 mm nodule in the right upper lobe which may represent a primary carcinoma long. 3. Cardiomegaly with marked thinning of the inferior wall of the left ventricle consistent with prior myocardial infarction. 4. Multiple bilateral anterior rib fractures.   Electronically Signed   By: Geanie Cooley M.D.   On: 02/17/2014 21:49   Dg Chest Port 1 View  02/27/2014   CLINICAL DATA:  Central line placement, intubated  EXAM: PORTABLE CHEST - 1 VIEW  COMPARISON:  None.  FINDINGS: Cardiomediastinal silhouette is unremarkable. There is poor inspiration. No acute infiltrate or pulmonary edema. Endotracheal tube in place with tip 5.5 cm above the carinal. No pneumothorax. There is right Port-A-Cath with tip in right atrium. NG tube in place. Left IJ  central line with tip in SVC.  IMPRESSION: Poor inspiration. No pneumothorax. No segmental infiltrate or pulmonary edema. Right Port-A-Cath with tip in right atrium. Endotracheal tube in place. NG tube in place. Left IJ central line with tip in SVC.   Electronically Signed   By: Natasha Mead M.D.   On: 03/07/2014 20:24    EKG: afib, inferior Q wave, RBBB, no sig changes as compared to 08/2013  Physical Exam: Blood pressure 172/105, pulse 71, temperature 95.2 F (35.1 C), temperature source Temporal, resp. rate 18, height 6' (1.829 m), weight 87.998 kg (194 lb), SpO2 100.00%. General: Well developed, well nourished, in no acute distress. Head: no trauma, R eye blind, L constricted Neck: Supple. Left IJ in place. JVD not elevated. Lungs: Clear bilaterally to auscultation without wheezes, rales, or rhonchi. Breathing is unlabored. Heart: irreg, irreg Abdomen:  non-distended with normoactive bowel sounds. No hepatomegaly. No rebound/guarding. No obvious abdominal masses. Msk:  Normal muscle mass. Extremities: No clubbing or cyanosis. No edema.  Distal pedal pulses are 2+ and equal bilaterally. Ext cool. Neuro: intubated and sedated Psych:  Intubated and sedated   Assessment and Plan:  Problem List 1. Vfib arrest, s/p defibrillation to perfusing rhythm after witnessed arrest and bystander CPR 2. CAD s/p remote MI 3. Afib, on coumadin 4. H/o stroke 5. R eye blindness 6. H/o seizure d/o 7. Thrombocytopenia  73 y.o. male w/ PMHx significant for CAD remote MI in the 1990s, h/o stroke, h/o seizure d/o, atrial fibrillation, HTN  who presented to [facility] on 03/01/2014 after vfib arrest today at 6:20 pm with quick bystander CPR and defibrillation, both good prognostic indicators. Due to this and the likelihood of cardiac origin, urgent catheterization is appropriate and is currently being undertaken (see cath report for further details). Also hemodynamic stable.  Differential includes  primary ischemic event versus scar induced VTach that degenerated to Vfib.   Cooling protocol also started. Appreciate assistance from critical care in co-managing patient.  Long conversation with family regarding indication for cath, benefits of cooling and need to wait to assess neuro status once off sedation (24-48 hours).  Hold diltiazem for now. Hold coumadin at this time. Caution during cooling with bleeding due to thrombocytopenia, elevated INR and coagulopathy from cooling. If remains hypertensive after cooling and sedation, would use hydralazine prn for goal SBP <  150.  Continue statin for pleotropic benefits (mildly elevated LFTs not contraindication). Continue aspirin.   Echo in the AM.  See cath report for additional details and plan.  Signed, Georgetta HaberWHITLOCK, Kamber Vignola C. MD 02/13/2014, 10:46 PM

## 2014-03-04 NOTE — ED Notes (Signed)
Per EMS: pt had witnessed arrest at home.  Pt found apneic and pulseless.  Pt initially in v fib, shocked once at 200 J, ROSC.  Pt given 300 amiodarone.  Pt currently in A fib on the monitor.  Last BP 160/100.  Left IO in place.  7.0 ETT.

## 2014-03-04 NOTE — H&P (Signed)
PULMONARY / CRITICAL CARE MEDICINE   Name: Moshe Wenger MRN: [id number] DOB: 1941/08/09    ADMISSION DATE:  March 19, 2014 CONSULTATION DATE:  19-Mar-2014  REFERRING MD :  EDP PRIMARY SERVICE: PCCM  CHIEF COMPLAINT:  Cardiac arrest  BRIEF PATIENT DESCRIPTION: 73 year old male with PMH MI, CVA, A-fib, and seizures suffered witnessed cardiac arrest 6/24.  Initial rhythm VF. ROSC achieved with defibrillation, CPR and amiodarone. In ED found to have ECG changes. PCCM asked to see for admission.   SIGNIFICANT EVENTS / STUDIES:  6/24 > Vfib arrest, to Cath lab  CT head - No acute intracranial process. Remote CVA  CTA chest - 1. No pulmonary emboli. 2. 10 mm nodule in RUL. Multiple bilateral anterior rib fractures.  LINES / TUBES: LIJ CVL 6/24 >>> R - port-a-cath (chronic) ETT 6/24 >>> Art line 6/24 >>>  CULTURES: Urine 6/24 >>>  ANTIBIOTICS: None  HISTORY OF PRESENT ILLNESS:  73 year old male with history as below which includes CAD, CVA, seizures, a-fib on coumadin. Suffered cardiac arrest 6/24 after eating dinner with family. Family immediately initiated CPR and called EMS. Upon EMS arrival, he was noted to be in VF. Cardioversion x 1 was successful for ROSC. Started on amiodarone and intubated by EMS. In ED CT head and chest were negative for acute abnormalities. Cardiology evaluated and determined some ECG changes and opted to take him to cath lab. PCCM asked to see for admission.   PAST MEDICAL HISTORY :  Past Medical History  Diagnosis Date  . A-fib   . Seizures   . Stroke 2006  . Hypertension   . Hypercholesteremia   . Blind right eye   . MI (myocardial infarction)    Past Surgical History  Procedure Laterality Date  . Hernia repair     Prior to Admission medications   Medication Sig Start Date End Date Taking? Authorizing Provider  diltiazem (TIAZAC) 120 MG 24 hr capsule Take 120 mg by mouth daily.    Historical Provider, MD  levETIRAcetam (KEPPRA) 500 MG tablet Take 1  tablet (500 mg total) by mouth 2 (two) times daily. 12/01/13   Glendale Chard, MD  simvastatin (ZOCOR) 40 MG tablet Take 40 mg by mouth daily.    Historical Provider, MD  theophylline (THEO-24) 300 MG 24 hr capsule Take 300 mg by mouth daily.    Historical Provider, MD  vitamin B-12 (CYANOCOBALAMIN) 500 MCG tablet Take 500 mcg by mouth daily.    Historical Provider, MD  Vitamin D, Ergocalciferol, (DRISDOL) 50000 UNITS CAPS capsule Take 50,000 Units by mouth every 7 (seven) days.    Historical Provider, MD  warfarin (COUMADIN) 2 MG tablet Take 2 mg by mouth daily.    Historical Provider, MD   No Known Allergies  FAMILY HISTORY:  Family History  Problem Relation Age of Onset  . Heart disease Mother     Died, 51  . Parkinson's disease Mother   . Heart disease Father     Died, 41  . Healthy Son    SOCIAL HISTORY:  reports that he has been smoking Cigars.  He does not have any smokeless tobacco history on file. He reports that he does not drink alcohol or use illicit drugs.  REVIEW OF SYSTEMS:  Unable as patient is intubated.   SUBJECTIVE:   VITAL SIGNS: Temp:  [90.1 F (32.3 C)-97.1 F (36.2 C)] 95.4 F (35.2 C) (06/24 2215) Pulse Rate:  [37-87] 67 (06/24 2210) Resp:  [12-20] 18 (06/24 2215)  BP: (103-160)/(70-95) 153/90 mmHg (06/24 2215) SpO2:  [100 %] 100 % (06/24 2210) FiO2 (%):  [60 %-100 %] 60 % (06/24 1953) Weight:  [87.998 kg (194 lb)-88.1 kg (194 lb 3.6 oz)] 87.998 kg (194 lb) (06/24 1954) HEMODYNAMICS:   VENTILATOR SETTINGS: Vent Mode:  [-] PRVC FiO2 (%):  [60 %-100 %] 60 % Set Rate:  [10 bmp-18 bmp] 18 bmp Vt Set:  [500 mL-550 mL] 550 mL PEEP:  [5 cmH20] 5 cmH20 Plateau Pressure:  [12 cmH20] 12 cmH20 INTAKE / OUTPUT: Intake/Output   None     PHYSICAL EXAMINATION: General:  Male of normal body habitus sedated on ventilator.  Neuro:  Sedated on vent HEENT:  [place]/AT, PERRL, No JVD noted Cardiovascular:  Irregular, rate controlled Lungs:  Clear  anteriorly. Abdomen:  Soft, non-distended Musculoskeletal:  No acute deformity Skin:  Intact  LABS:  CBC  Recent Labs Lab 02/20/2014 1949  WBC 5.5  HGB 13.9  HCT 38.7*  PLT 59*   Coag's  Recent Labs Lab 02/14/2014 1949  APTT 35  INR 1.83*   BMET  Recent Labs Lab 03/03/2014 1949  NA 140  K 3.8  CL 103  CO2 20  BUN 19  CREATININE 1.11  GLUCOSE 262*   Electrolytes  Recent Labs Lab 03/08/2014 1949  CALCIUM 8.3*   Sepsis Markers  Recent Labs Lab 02/14/2014 1956  LATICACIDVEN 3.6*   ABG  Recent Labs Lab 03/03/2014 1950  PHART 7.218*  PCO2ART 59.5*  PO2ART 449.0*   Liver Enzymes  Recent Labs Lab 02/23/2014 1949  AST 70*  ALT 59*  ALKPHOS 69  BILITOT 0.6  ALBUMIN 3.4*   Cardiac Enzymes  Recent Labs Lab 02/09/2014 1949  TROPONINI <0.30   Glucose  Recent Labs Lab 02/23/2014 2009  GLUCAP 214*    Imaging Ct Head Wo Contrast  02/22/2014   CLINICAL DATA:  Cardiac arrest.  Query intracranial sequela.  EXAM: CT HEAD WITHOUT CONTRAST  TECHNIQUE: Contiguous axial images were obtained from the base of the skull through the vertex without intravenous contrast.  COMPARISON:  08/26/2013  FINDINGS: Remote left posterior MCA distribution infarct. Distribution not changed from 08/26/2013. Adjacent white matter hypodensity also similar to prior.  The brainstem, cerebellum, cerebral peduncle is, thalami, and basal ganglia appear intact. Ventricular system unremarkable.  Periventricular white matter and corona radiata hypodensities favor chronic ischemic microvascular white matter disease. No intracranial hemorrhage, mass lesion, or acute CVA.  Chronic bilateral maxillary and ethmoid sinusitis.  Stable right lacrimal duct enlargement.  IMPRESSION: 1. No acute intracranial findings. 2. Remote left posterior MCA distribution infarct. Periventricular white matter and corona radiata hypodensities favor chronic ischemic microvascular white matter disease. 3. Chronic bilateral  maxillary and ethmoid sinusitis. 4. Stable right lacrimal duct enlargement.   Electronically Signed   By: Herbie Baltimore M.D.   On: 02/13/2014 21:43   Ct Angio Chest Pe W/cm &/or Wo Cm  02/19/2014   CLINICAL DATA:  Cardiac arrest.  EXAM: CT ANGIOGRAPHY CHEST WITH CONTRAST  TECHNIQUE: Multidetector CT imaging of the chest was performed using the standard protocol during bolus administration of intravenous contrast. Multiplanar CT image reconstructions and MIPs were obtained to evaluate the vascular anatomy.  CONTRAST:  OMNIPAQUE IOHEXOL 350 MG/ML SOLN  COMPARISON:  Chest x-ray dated 02/17/2014  FINDINGS: There are no pulmonary emboli. There is a 10 x 9 x 8 mm pulmonary nodule at the anterior aspect of the right hilum in the right upper lobe worrisome for primary lung malignancy.  There  are tiny bilateral pleural effusions with slight atelectasis at the lung bases. There is cardiomegaly with marked thinning of the inferior wall of the left ventricle consistent with prior myocardial infarction.  There are multiple anterior rib fractures, possibly secondary to CPR.  No pneumothorax. There is slight peribronchial thickening particularly in the lower lobes. There is a 14 mm lymph node to the left of the trachea just above the carina as well as a 16 mm lymph node in the aortopulmonary window.  Review of the MIP images confirms the above findings.  IMPRESSION: 1. No pulmonary emboli. 2. 10 mm nodule in the right upper lobe which may represent a primary carcinoma long. 3. Cardiomegaly with marked thinning of the inferior wall of the left ventricle consistent with prior myocardial infarction. 4. Multiple bilateral anterior rib fractures.   Electronically Signed   By: Geanie CooleyJim  Maxwell M.D.   On: 03/07/2014 21:49   Dg Chest Port 1 View  02/12/2014   CLINICAL DATA:  Central line placement, intubated  EXAM: PORTABLE CHEST - 1 VIEW  COMPARISON:  None.  FINDINGS: Cardiomediastinal silhouette is unremarkable. There is poor  inspiration. No acute infiltrate or pulmonary edema. Endotracheal tube in place with tip 5.5 cm above the carinal. No pneumothorax. There is right Port-A-Cath with tip in right atrium. NG tube in place. Left IJ central line with tip in SVC.  IMPRESSION: Poor inspiration. No pneumothorax. No segmental infiltrate or pulmonary edema. Right Port-A-Cath with tip in right atrium. Endotracheal tube in place. NG tube in place. Left IJ central line with tip in SVC.   Electronically Signed   By: Natasha MeadLiviu  Pop M.D.   On: 02/17/2014 20:24     ASSESSMENT / PLAN:  PULMONARY A: Acute respiratory failure 2nd to cardiac arrest H/o COPD Lung nodule on CT  P:   Full vent support Follow ABG, CXR Pulmonary hygeine Scheduled BD's Holding theophyline, check level  CARDIOVASCULAR A:  Resuscitated VF arrest,  likely 2nd to MI. Cards noted ECG changes, no troponin elevation.  Hypertension Atrial fib H/o CAD  P:  To cath lab per cards Therapeutic hypothermia per protocol Levophed if needed to maintain MAP goal > 65 Continue statin Trend troponin, lactic acid Echo PRN hydralazine to keep SBP < 16570mm/Hg  RENAL A:   Lactic acidosis  P:   IVF  Follow BMP  GASTROINTESTINAL A:   GI ppx  P:   PPI  HEMATOLOGIC A:   Coumadin coagulopathy for AF -subtherapeutic at admission VTE ppx  P:  Trend CBC, coags Heparin SQ Holding coumadin  INFECTIOUS A:  No evidence for infection  P:   Monitor clinically Trend WBC and fever curve  ENDOCRINE A:   No acute issues  P:   CBG and SSI while NPO  NEUROLOGIC A:   Acute encephalopathy 2nd to cardiac arrest Sedation H/o seizures, CVA  P:   RASS goal: -4 Deep sedation and NMB per hypothermia protocol Continue keppra   Joneen RoachPaul Hoffman, ACNP [place] [facility]/Critical [facility] Pager [phone] or [phone]    I have personally obtained a history, examined the patient, evaluated laboratory and imaging results, formulated the  assessment and plan and placed orders. CRITICAL CARE: The patient is critically ill with multiple organ systems failure and requires high complexity decision making for assessment and support, frequent evaluation and titration of therapies, application of advanced monitoring technologies and extensive interpretation of multiple databases. Critical Care Time devoted to patient care services described in this note is 40 minutes.  Billy Fischeravid Jael Waldorf, MD ; [facility] service Mobile [phone].  After 5:30 PM or weekends, call [phone]

## 2014-03-04 NOTE — ED Notes (Signed)
Per Cardiologist, pt to be taken to cath lab.  Radiopaque zoll pads placed on pt.

## 2014-03-04 NOTE — ED Notes (Signed)
Dr Simonds at bedside. 

## 2014-03-04 NOTE — ED Notes (Signed)
50 mcg of Fentanyl given.

## 2014-03-04 NOTE — ED Notes (Signed)
Per lab, creatinine 1.11.  Awaiting results to cross over.

## 2014-03-04 NOTE — ED Notes (Addendum)
Cardiology at bedside.

## 2014-03-04 NOTE — ED Notes (Signed)
Cold saline started.

## 2014-03-04 NOTE — ED Notes (Signed)
Pt back from CT

## 2014-03-04 NOTE — ED Notes (Signed)
Zoll pads placed on patient. Arctic sun pads placed and cooling therapy started.

## 2014-03-04 NOTE — ED Notes (Signed)
Plaid shirt, khaki pants placed in belongings bag. No wallet, jewelry found in clothing.

## 2014-03-05 ENCOUNTER — Inpatient Hospital Stay (HOSPITAL_COMMUNITY): Payer: Medicare Other

## 2014-03-05 DIAGNOSIS — I2589 Other forms of chronic ischemic heart disease: Secondary | ICD-10-CM

## 2014-03-05 DIAGNOSIS — I255 Ischemic cardiomyopathy: Secondary | ICD-10-CM | POA: Diagnosis present

## 2014-03-05 DIAGNOSIS — I517 Cardiomegaly: Secondary | ICD-10-CM

## 2014-03-05 DIAGNOSIS — I2510 Atherosclerotic heart disease of native coronary artery without angina pectoris: Secondary | ICD-10-CM | POA: Diagnosis present

## 2014-03-05 DIAGNOSIS — I4891 Unspecified atrial fibrillation: Secondary | ICD-10-CM | POA: Diagnosis present

## 2014-03-05 LAB — BASIC METABOLIC PANEL
BUN: 14 mg/dL (ref 6–23)
BUN: 14 mg/dL (ref 6–23)
BUN: 14 mg/dL (ref 6–23)
BUN: 14 mg/dL (ref 6–23)
CALCIUM: 8.6 mg/dL (ref 8.4–10.5)
CHLORIDE: 99 meq/L (ref 96–112)
CHLORIDE: 108 meq/L (ref 96–112)
CHLORIDE: 102 meq/L (ref 96–112)
CO2: 22 mEq/L (ref 19–32)
CO2: 21 mEq/L (ref 19–32)
CO2: 21 mEq/L (ref 19–32)
CO2: 20 meq/L (ref 19–32)
CREATININE: 0.75 mg/dL (ref 0.50–1.35)
Calcium: 8.6 mg/dL (ref 8.4–10.5)
Calcium: 8.3 mg/dL — ABNORMAL LOW (ref 8.4–10.5)
Calcium: 8.4 mg/dL (ref 8.4–10.5)
Chloride: 106 mEq/L (ref 96–112)
Creatinine, Ser: 0.68 mg/dL (ref 0.50–1.35)
Creatinine, Ser: 0.71 mg/dL (ref 0.50–1.35)
Creatinine, Ser: 0.71 mg/dL (ref 0.50–1.35)
GFR calc Af Amer: >90 mL/min (ref >90)
GFR calc Af Amer: >90 mL/min (ref >90)
GFR calc non Af Amer: 88 mL/min — ABNORMAL LOW (ref >90)
GFR calc non Af Amer: >90 mL/min (ref >90)
GFR calc non Af Amer: 90 mL/min — ABNORMAL LOW (ref >90)
GFR calc non Af Amer: 90 mL/min — ABNORMAL LOW (ref >90)
GLUCOSE: 195 mg/dL — AB (ref 70–99)
GLUCOSE: 113 mg/dL — AB (ref 70–99)
Glucose, Bld: 144 mg/dL — ABNORMAL HIGH (ref 70–99)
Glucose, Bld: 176 mg/dL — ABNORMAL HIGH (ref 70–99)
POTASSIUM: 3.3 meq/L — AB (ref 3.7–5.3)
POTASSIUM: 3.5 meq/L — AB (ref 3.7–5.3)
Potassium: 3.6 mEq/L — ABNORMAL LOW (ref 3.7–5.3)
Potassium: 3.6 mEq/L — ABNORMAL LOW (ref 3.7–5.3)
SODIUM: 141 meq/L (ref 137–147)
SODIUM: 137 meq/L (ref 137–147)
Sodium: 137 mEq/L (ref 137–147)
Sodium: 144 mEq/L (ref 137–147)

## 2014-03-05 LAB — POCT I-STAT 3, ART BLOOD GAS (G3+)
ACID-BASE DEFICIT: 4 mmol/L — AB (ref 0.0–2.0)
Acid-base deficit: 2 mmol/L (ref 0.0–2.0)
BICARBONATE: 21.4 meq/L (ref 20.0–24.0)
BICARBONATE: 22.8 meq/L (ref 20.0–24.0)
O2 Saturation: 100 %
O2 Saturation: 100 %
PCO2 ART: 33.8 mmHg — AB (ref 35.0–45.0)
PH ART: 7.425 (ref 7.350–7.450)
PH ART: 7.419 (ref 7.350–7.450)
Patient temperature: 32.7
TCO2: 23 mmol/L (ref 0–100)
TCO2: 24 mmol/L (ref 0–100)
pCO2 arterial: 31.2 mmHg — ABNORMAL LOW (ref 35.0–45.0)
pO2, Arterial: 191 mmHg — ABNORMAL HIGH (ref 80.0–100.0)
pO2, Arterial: 188 mmHg — ABNORMAL HIGH (ref 80.0–100.0)

## 2014-03-05 LAB — GLUCOSE, CAPILLARY
GLUCOSE-CAPILLARY: 105 mg/dL — AB (ref 70–99)
GLUCOSE-CAPILLARY: 203 mg/dL — AB (ref 70–99)
Glucose-Capillary: 110 mg/dL — ABNORMAL HIGH (ref 70–99)
Glucose-Capillary: 124 mg/dL — ABNORMAL HIGH (ref 70–99)
Glucose-Capillary: 127 mg/dL — ABNORMAL HIGH (ref 70–99)
Glucose-Capillary: 152 mg/dL — ABNORMAL HIGH (ref 70–99)
Glucose-Capillary: 159 mg/dL — ABNORMAL HIGH (ref 70–99)
Glucose-Capillary: 166 mg/dL — ABNORMAL HIGH (ref 70–99)
Glucose-Capillary: 167 mg/dL — ABNORMAL HIGH (ref 70–99)
Glucose-Capillary: 184 mg/dL — ABNORMAL HIGH (ref 70–99)
Glucose-Capillary: 190 mg/dL — ABNORMAL HIGH (ref 70–99)
Glucose-Capillary: 192 mg/dL — ABNORMAL HIGH (ref 70–99)
Glucose-Capillary: 197 mg/dL — ABNORMAL HIGH (ref 70–99)
Glucose-Capillary: 90 mg/dL (ref 70–99)
Glucose-Capillary: 94 mg/dL (ref 70–99)

## 2014-03-05 LAB — MRSA PCR SCREENING: MRSA by PCR: NEGATIVE

## 2014-03-05 LAB — CBC
HCT: 41.6 % (ref 39.0–52.0)
HEMOGLOBIN: 14.5 g/dL (ref 13.0–17.0)
MCH: 32.4 pg (ref 26.0–34.0)
MCHC: 34.9 g/dL (ref 30.0–36.0)
MCV: 93.1 fL (ref 78.0–100.0)
Platelets: 49 10*3/uL — ABNORMAL LOW (ref 150–400)
RBC: 4.47 MIL/uL (ref 4.22–5.81)
RDW: 14 % (ref 11.5–15.5)
WBC: 5.6 10*3/uL (ref 4.0–10.5)

## 2014-03-05 LAB — URINE CULTURE
CULTURE: NO GROWTH
Colony Count: NO GROWTH

## 2014-03-05 LAB — POCT I-STAT, CHEM 8
BUN: 14 mg/dL (ref 6–23)
BUN: 13 mg/dL (ref 6–23)
BUN: 13 mg/dL (ref 6–23)
CALCIUM ION: 1.2 mmol/L (ref 1.13–1.30)
CHLORIDE: 100 meq/L (ref 96–112)
CHLORIDE: 102 meq/L (ref 96–112)
CREATININE: 0.7 mg/dL (ref 0.50–1.35)
Calcium, Ion: 1.21 mmol/L (ref 1.13–1.30)
Calcium, Ion: 1.27 mmol/L (ref 1.13–1.30)
Chloride: 101 mEq/L (ref 96–112)
Creatinine, Ser: 0.8 mg/dL (ref 0.50–1.35)
Creatinine, Ser: 0.8 mg/dL (ref 0.50–1.35)
GLUCOSE: 150 mg/dL — AB (ref 70–99)
Glucose, Bld: 198 mg/dL — ABNORMAL HIGH (ref 70–99)
Glucose, Bld: 167 mg/dL — ABNORMAL HIGH (ref 70–99)
HCT: 41 % (ref 39.0–52.0)
HEMATOCRIT: 45 % (ref 39.0–52.0)
HEMATOCRIT: 49 % (ref 39.0–52.0)
HEMOGLOBIN: 13.9 g/dL (ref 13.0–17.0)
Hemoglobin: 16.7 g/dL (ref 13.0–17.0)
Hemoglobin: 15.3 g/dL (ref 13.0–17.0)
Potassium: 3.3 mEq/L — ABNORMAL LOW (ref 3.7–5.3)
Potassium: 3.4 mEq/L — ABNORMAL LOW (ref 3.7–5.3)
Potassium: 3.4 mEq/L — ABNORMAL LOW (ref 3.7–5.3)
SODIUM: 143 meq/L (ref 137–147)
Sodium: 140 mEq/L (ref 137–147)
Sodium: 143 mEq/L (ref 137–147)
TCO2: 21 mmol/L (ref 0–100)
TCO2: 21 mmol/L (ref 0–100)
TCO2: 22 mmol/L (ref 0–100)

## 2014-03-05 LAB — PROTIME-INR
INR: 1.89 — ABNORMAL HIGH (ref 0.00–1.49)
INR: 2.01 — ABNORMAL HIGH (ref 0.00–1.49)
PROTHROMBIN TIME: 22.8 s — AB (ref 11.6–15.2)
Prothrombin Time: 21.7 seconds — ABNORMAL HIGH (ref 11.6–15.2)

## 2014-03-05 LAB — THEOPHYLLINE LEVEL: Theophylline Lvl: 4.8 ug/mL — ABNORMAL LOW (ref 10.0–20.0)

## 2014-03-05 LAB — LACTIC ACID, PLASMA
LACTIC ACID, VENOUS: 1.9 mmol/L (ref 0.5–2.2)
LACTIC ACID, VENOUS: 2.2 mmol/L (ref 0.5–2.2)
LACTIC ACID, VENOUS: 3 mmol/L — AB (ref 0.5–2.2)
Lactic Acid, Venous: 1.7 mmol/L (ref 0.5–2.2)

## 2014-03-05 LAB — PRO B NATRIURETIC PEPTIDE: Pro B Natriuretic peptide (BNP): 1114 pg/mL — ABNORMAL HIGH (ref 0–450)

## 2014-03-05 LAB — APTT: aPTT: >200 seconds (ref 24–37)

## 2014-03-05 LAB — PHOSPHORUS: PHOSPHORUS: 2 mg/dL — AB (ref 2.3–4.6)

## 2014-03-05 LAB — MAGNESIUM: MAGNESIUM: 1.7 mg/dL (ref 1.5–2.5)

## 2014-03-05 MED ORDER — SODIUM CHLORIDE 0.9 % IV SOLN
1.0000 ug/kg/min | INTRAVENOUS | Status: DC
  Administered 2014-03-05: 1.5 ug/kg/min via INTRAVENOUS
  Filled 2014-03-05: qty 20

## 2014-03-05 MED ORDER — POTASSIUM CHLORIDE 20 MEQ/15ML (10%) PO LIQD
20.0000 meq | Freq: Every day | ORAL | Status: AC
  Administered 2014-03-05: 20 meq
  Filled 2014-03-05: qty 15

## 2014-03-05 MED ORDER — SODIUM CHLORIDE 0.9 % IV SOLN: 250.0000 mL | INTRAVENOUS | Status: DC | PRN

## 2014-03-05 MED ORDER — SIMVASTATIN 40 MG PO TABS: 40.0000 mg | ORAL_TABLET | Freq: Every day | ORAL | Status: DC

## 2014-03-05 MED ORDER — METOPROLOL TARTRATE 25 MG PO TABS
25.0000 mg | ORAL_TABLET | Freq: Two times a day (BID) | ORAL | Status: DC
  Administered 2014-03-05: 25 mg via NASOGASTRIC
  Filled 2014-03-05 (×3): qty 1

## 2014-03-05 MED ORDER — INSULIN ASPART 100 UNIT/ML ~~LOC~~ SOLN
2.0000 [IU] | SUBCUTANEOUS | Status: DC
  Administered 2014-03-05 (×5): 4 [IU] via SUBCUTANEOUS

## 2014-03-05 MED ORDER — SODIUM CHLORIDE 0.9 % IV SOLN
500.0000 mg | Freq: Two times a day (BID) | INTRAVENOUS | Status: DC
  Administered 2014-03-05 – 2014-03-08 (×8): 500 mg via INTRAVENOUS
  Filled 2014-03-05 (×10): qty 5

## 2014-03-05 MED ORDER — ATORVASTATIN CALCIUM 80 MG PO TABS
80.0000 mg | ORAL_TABLET | Freq: Every day | ORAL | Status: DC
  Administered 2014-03-05 – 2014-03-16 (×12): 80 mg via NASOGASTRIC
  Filled 2014-03-05 (×13): qty 1

## 2014-03-05 MED ORDER — PANTOPRAZOLE SODIUM 40 MG IV SOLR
40.0000 mg | Freq: Every day | INTRAVENOUS | Status: DC
  Administered 2014-03-05 – 2014-03-07 (×4): 40 mg via INTRAVENOUS
  Filled 2014-03-05 (×5): qty 40

## 2014-03-05 MED ORDER — DEXTROSE-NACL 5-0.9 % IV SOLN
INTRAVENOUS | Status: DC
  Administered 2014-03-05: 1000 mL via INTRAVENOUS
  Administered 2014-03-06 – 2014-03-08 (×3): via INTRAVENOUS
  Administered 2014-03-09 (×2): 1000 mL via INTRAVENOUS

## 2014-03-05 MED ORDER — BIOTENE DRY MOUTH MT LIQD
15.0000 mL | Freq: Four times a day (QID) | OROMUCOSAL | Status: DC
  Administered 2014-03-05 – 2014-03-17 (×49): 15 mL via OROMUCOSAL

## 2014-03-05 MED ORDER — SODIUM CHLORIDE 0.9 % IV SOLN
INTRAVENOUS | Status: DC
  Administered 2014-03-05: 50 mL/h via INTRAVENOUS

## 2014-03-05 MED ORDER — SODIUM CHLORIDE 0.9 % IV SOLN
250.0000 mL | INTRAVENOUS | Status: DC | PRN
  Administered 2014-03-08 – 2014-03-11 (×2): 250 mL via INTRAVENOUS

## 2014-03-05 MED ORDER — SODIUM CHLORIDE 0.9 % IJ SOLN
3.0000 mL | Freq: Two times a day (BID) | INTRAMUSCULAR | Status: DC
  Administered 2014-03-05 (×2): 3 mL via INTRAVENOUS

## 2014-03-05 MED ORDER — CHLORHEXIDINE GLUCONATE 0.12 % MT SOLN
15.0000 mL | Freq: Two times a day (BID) | OROMUCOSAL | Status: DC
  Administered 2014-03-05 – 2014-03-17 (×25): 15 mL via OROMUCOSAL
  Filled 2014-03-05 (×25): qty 15

## 2014-03-05 MED ORDER — HEPARIN SODIUM (PORCINE) 5000 UNIT/ML IJ SOLN
5000.0000 [IU] | Freq: Three times a day (TID) | INTRAMUSCULAR | Status: DC
  Filled 2014-03-05: qty 1

## 2014-03-05 MED ORDER — SODIUM CHLORIDE 0.9 % IJ SOLN
3.0000 mL | INTRAMUSCULAR | Status: DC | PRN
  Administered 2014-03-05: 3 mL via INTRAVENOUS

## 2014-03-05 MED ORDER — POTASSIUM CHLORIDE 20 MEQ/15ML (10%) PO LIQD
20.0000 meq | ORAL | Status: AC
  Administered 2014-03-05 (×2): 20 meq
  Filled 2014-03-05 (×2): qty 15

## 2014-03-05 NOTE — Progress Notes (Signed)
Echo Lab  2D Echocardiogram completed.  Melissa L Morford, RDCS 03/05/2014 9:14 AM   

## 2014-03-05 NOTE — Progress Notes (Signed)
Chaplain was paged by nurse to come and support family. PT was brought in as a CPR. The chaplain was not called or paged at the pt arrival and the nurse became aware of this and called chaplain. Chaplain provided prayer, emotional support, updated information and nourishment. Chaplain sat with family until their pastor arrived and family denied any other services Cindie CrumblyBeverley Jarrell, [place]Chaplain  03/05/14 0800  Clinical Encounter Type  Visited With Family  Visit Type ED  Referral From Nurse  Consult/Referral To Chaplain  Spiritual Encounters  Spiritual Needs Emotional;Prayer  Stress Factors  Patient Stress Factors None identified  Family Stress Factors None identified

## 2014-03-05 NOTE — Progress Notes (Signed)
PULMONARY / CRITICAL CARE MEDICINE   Name: Kyle [name] MRN: [id number] DOB: 07/04/1941    ADMISSION DATE:  November 11, 2013 CONSULTATION DATE:  November 11, 2013  REFERRING MD :  EDP PRIMARY SERVICE: PCCM  CHIEF COMPLAINT:  Cardiac arrest  BRIEF PATIENT DESCRIPTION: 73 year old male with PMH MI, CVA, A-fib, and seizures suffered witnessed cardiac arrest 6/24.  Initial rhythm VF. ROSC achieved with defibrillation, CPR and amiodarone. In ED found to have ECG changes. PCCM asked to see for admission.   SIGNIFICANT EVENTS / STUDIES:  6/24 > Vfib arrest, to Cath lab  CT head - No acute intracranial process. Remote CVA  CTA chest - 1. No pulmonary emboli. 2. 10 mm nodule in RUL. Multiple bilateral anterior rib fractures.  LINES / TUBES: LIJ CVL 6/24 >>> R - port-a-cath (chronic) ETT 6/24 >>>  CULTURES: Urine 6/24 >>> MRSA by PCR 6/24 >>> neg ANTIBIOTICS: None  SUBJECTIVE: Patient is intubated, sedated. Had emergent cardiac cath last PM which showed high-grade CAD with complex dz involving LAD and RCA. Per cards, he is a CABG candidate if he recovers neurological function. Currently undergoing therapeutic hypothermia.  Per nurse, patient has been consistently bradycardic with some pauses.  Attempts were made to place an A-line but were unsuccessful, need to attempt this today.    VITAL SIGNS: Temp:  [90.1 F (32.3 C)-97.1 F (36.2 C)] 90.7 F (32.6 C) (06/25 0800) Pulse Rate:  [29-87] 44 (06/25 0830) Resp:  [8-20] 18 (06/25 0830) BP: (84-190)/(51-131) 146/78 mmHg (06/25 0830) SpO2:  [100 %] 100 % (06/25 0830) FiO2 (%):  [50 %-100 %] 50 % (06/25 0820) Weight:  [79.91 kg (176 lb 2.7 oz)-88.1 kg (194 lb 3.6 oz)] 80 kg (176 lb 5.9 oz) (06/25 0330)  HEMODYNAMICS: CVP:  [2 mmHg-12 mmHg] 5 mmHg  VENTILATOR SETTINGS: Vent Mode:  [-] PRVC FiO2 (%):  [50 %-100 %] 50 % Set Rate:  [10 bmp-18 bmp] 18 bmp Vt Set:  [500 mL-550 mL] 550 mL PEEP:  [5 cmH20] 5 cmH20 Plateau Pressure:  [12 cmH20-14  cmH20] 14 cmH20  INTAKE / OUTPUT: Intake/Output     06/24 0701 - 06/25 0700 06/25 0701 - 06/26 0700   I.V. (mL/kg) 2796.3 (35) 84.2 (1.1)   NG/GT 50    IV Piggyback 100    Total Intake(mL/kg) 2946.3 (36.8) 84.2 (1.1)   Urine (mL/kg/hr) 2610 20 (0.1)   Other 250    Total Output 2860 20   Net +86.3 +64.2          PHYSICAL EXAMINATION: General:  Male of normal body habitus, sedated, on ventilator.  Neuro:  Intubated, sedated, on vent.  HEENT:  [place]/AT, PERRL, No JVD noted. ETT in place. Cardiovascular: Irregular, bradycardic. No m/g/r. Delayed cap refill. Mild peripheral edema. Lungs:  On vent. CTA anteriorly.   Abdomen:  Soft, non-distended. BS + Musculoskeletal:  No acute deformities noted. Mild peripheral edema. Skin:  Cool, dry, intact.  LABS:  CBC  Recent Labs Lab 2013/11/07 1949  03/05/14 0300 03/05/14 0444 03/05/14 0636  WBC 5.5  --  5.6  --   --   HGB 13.9  < > 14.5 13.9 15.3  HCT 38.7*  < > 41.6 41.0 45.0  PLT 59*  --  49*  --   --   < > = values in this interval not displayed. Coag's  Recent Labs Lab 2013/11/07 1949 03/05/14 0055  APTT 35 >200*  INR 1.83* 1.89*   BMET  Recent Labs Lab 2013/11/07 1949  03/05/14 0300 03/05/14 0444 03/05/14 0636  NA 140  < > 137 143 143  K 3.8  < > 3.6* 3.4* 3.4*  CL 103  < > 99 101 102  CO2 20  --  22  --   --   BUN 19  < > 14 13 13   CREATININE 1.11  < > 0.75 0.70 0.80  GLUCOSE 262*  < > 195* 167* 150*  < > = values in this interval not displayed. Electrolytes  Recent Labs Lab 02/13/2014 1949 03/05/14 0300  CALCIUM 8.3* 8.6  MG  --  1.7  PHOS  --  2.0*   Sepsis Markers  Recent Labs Lab 02/16/2014 1956 03/05/14 0254  LATICACIDVEN 3.6* 1.9   ABG  Recent Labs Lab  1950 03/05/14 0154  PHART 7.218* 7.425  PCO2ART 59.5* 31.2*  PO2ART 449.0* 191.0*   Liver Enzymes  Recent Labs Lab 02/24/2014 1949  AST 70*  ALT 59*  ALKPHOS 69  BILITOT 0.6  ALBUMIN 3.4*   Cardiac Enzymes  Recent  Labs Lab 02/28/2014 1949 03/05/14 0307  TROPONINI <0.30  --   PROBNP  --  1114.0*   Glucose  Recent Labs Lab 03/05/14 0131 03/05/14 0241 03/05/14 0341 03/05/14 0433 03/05/14 0540 03/05/14 0740  GLUCAP 197* 167* 192* 184* 190* 152*    Imaging Ct Head Wo Contrast  03/08/2014   CLINICAL DATA:  Cardiac arrest.  Query intracranial sequela.  EXAM: CT HEAD WITHOUT CONTRAST  TECHNIQUE: Contiguous axial images were obtained from the base of the skull through the vertex without intravenous contrast.  COMPARISON:  08/26/2013  FINDINGS: Remote left posterior MCA distribution infarct. Distribution not changed from 08/26/2013. Adjacent white matter hypodensity also similar to prior.  The brainstem, cerebellum, cerebral peduncle is, thalami, and basal ganglia appear intact. Ventricular system unremarkable.  Periventricular white matter and corona radiata hypodensities favor chronic ischemic microvascular white matter disease. No intracranial hemorrhage, mass lesion, or acute CVA.  Chronic bilateral maxillary and ethmoid sinusitis.  Stable right lacrimal duct enlargement.  IMPRESSION: 1. No acute intracranial findings. 2. Remote left posterior MCA distribution infarct. Periventricular white matter and corona radiata hypodensities favor chronic ischemic microvascular white matter disease. 3. Chronic bilateral maxillary and ethmoid sinusitis. 4. Stable right lacrimal duct enlargement.   Electronically Signed   By: Herbie Baltimore M.D.   On: 03/07/2014 21:43   Ct Angio Chest Pe W/cm &/or Wo Cm  03/01/2014   CLINICAL DATA:  Cardiac arrest.  EXAM: CT ANGIOGRAPHY CHEST WITH CONTRAST  TECHNIQUE: Multidetector CT imaging of the chest was performed using the standard protocol during bolus administration of intravenous contrast. Multiplanar CT image reconstructions and MIPs were obtained to evaluate the vascular anatomy.  CONTRAST:  OMNIPAQUE IOHEXOL 350 MG/ML SOLN  COMPARISON:  Chest x-ray dated 02/10/2014   FINDINGS: There are no pulmonary emboli. There is a 10 x 9 x 8 mm pulmonary nodule at the anterior aspect of the right hilum in the right upper lobe worrisome for primary lung malignancy.  There are tiny bilateral pleural effusions with slight atelectasis at the lung bases. There is cardiomegaly with marked thinning of the inferior wall of the left ventricle consistent with prior myocardial infarction.  There are multiple anterior rib fractures, possibly secondary to CPR.  No pneumothorax. There is slight peribronchial thickening particularly in the lower lobes. There is a 14 mm lymph node to the left of the trachea just above the carina as well as a 16 mm lymph node in the  aortopulmonary window.  Review of the MIP images confirms the above findings.  IMPRESSION: 1. No pulmonary emboli. 2. 10 mm nodule in the right upper lobe which may represent a primary carcinoma long. 3. Cardiomegaly with marked thinning of the inferior wall of the left ventricle consistent with prior myocardial infarction. 4. Multiple bilateral anterior rib fractures.   Electronically Signed   By: Geanie Cooley M.D.   On: 02/21/2014 21:49   Dg Chest Port 1 View  03/05/2014   CLINICAL DATA:  Endotracheal tube position.  EXAM: PORTABLE CHEST - 1 VIEW  COMPARISON:  March 04, 2014.  FINDINGS: The heart size and mediastinal contours are within normal limits. Both lungs are clear. Endotracheal tube is seen in grossly good position with distal tip 6 cm above the carina. Nasogastric tube is in grossly good position. Right-sided Port-A-Cath and left internal jugular central venous catheter are unchanged in position. No pneumothorax or pleural effusion is noted. The visualized skeletal structures are unremarkable.  IMPRESSION: Endotracheal tube in grossly good position. No acute cardiopulmonary abnormality seen.   Electronically Signed   By: Roque Lias M.D.   On: 03/05/2014 07:10   Dg Chest Port 1 View  03/05/2014   CLINICAL DATA:  Central line  placement, intubated  EXAM: PORTABLE CHEST - 1 VIEW  COMPARISON:  None.  FINDINGS: Cardiomediastinal silhouette is unremarkable. There is poor inspiration. No acute infiltrate or pulmonary edema. Endotracheal tube in place with tip 5.5 cm above the carinal. No pneumothorax. There is right Port-A-Cath with tip in right atrium. NG tube in place. Left IJ central line with tip in SVC.  IMPRESSION: Poor inspiration. No pneumothorax. No segmental infiltrate or pulmonary edema. Right Port-A-Cath with tip in right atrium. Endotracheal tube in place. NG tube in place. Left IJ central line with tip in SVC.   Electronically Signed   By: Natasha Mead M.D.   On: 02/14/2014 20:24     ASSESSMENT / PLAN:  PULMONARY A: Acute respiratory failure 2nd to cardiac arrest H/o COPD Lung nodule on CT  P:   - Full vent support - Follow ABG, CXR - Scheduled BD's - Holding theophylline, check level  CARDIOVASCULAR A:  Resuscitated VF arrest,  likely 2nd to MI. Cards noted ECG changes, no troponin elevation.  Hypertension Atrial fib H/o CAD  P:  - Cath lab 6/24 PM, severe double vessel dz (LAD/RCA) - Therapeutic hypothermia per protocol - Levophed, goal MAP > 65 - Per cards, consider temp transvenous pacing wire if remains bradycardic/hypotensive - Continue statin, ASA - Troponin neg so far, trend - Lactic acid downtrending - Echo 6/24 LVEF 25%, rpt 6/25 - PRN hydralazine - CABG candidate per cards if neuro fxn recovers  RENAL A:   Lactic acidosis  P:   - IVF  - Follow BMP - Replace lytes per protocol   GASTROINTESTINAL A:   GI ppx  P:   - PPI  HEMATOLOGIC A:   Coumadin coagulopathy for AF -subtherapeutic at admission VTE ppx  P:  - Trend CBC, coags (6/25INR 1.89) - Heparin SQ - Holding coumadin  INFECTIOUS A:  No evidence for infection  P:   - Monitor clinically - Trend WBC  ENDOCRINE A:   No acute issues  P:   - NPO while cooled - SSI  NEUROLOGIC A:   Acute  encephalopathy 2nd to cardiac arrest Sedation H/o seizures, CVA  P:   - RASS goal: -4 - Deep sedation and NMB per hypothermia protocol - Continue  Keppra  Elenore PaddyStephanie M. Reese, PA-S  Will continue with the hypothermia protocol, once warmed will need to assess neuro status to determine if a candidate for CABG or not.  Patient seen and examined, agree with above note, note edited in full and I dictated plan of care.  CC time 35 min.  Alyson ReedyWesam G. Yacoub, M.D. [facility]. Pager: [phone]. After hours pager: [phone].

## 2014-03-05 NOTE — Progress Notes (Signed)
INITIAL NUTRITION ASSESSMENT  DOCUMENTATION CODES Per approved criteria  -Not Applicable   INTERVENTION: Once patient is re-warmed, if enteral nutrition warranted, recommend initiation of Vital AF 1.2 @ 25 ml/hr via OGT and increase by 10 ml every 6 hours to goal rate of 65 ml/hr. Tube feeding regimen will provide 1872 kcal (105% of needs), 117 grams of protein, and 1265 ml of H2O.  RD to continue to follow nutrition care plan.  NUTRITION DIAGNOSIS: Inadequate oral intake related to inability to eat as evidenced by NPO status.   Goal: Initiation of nutrition support within 24-48 hours of intubation and once pt re-warmed. Intake to meet >90% of estimated nutrition needs.  Monitor:  weight trends, lab trends, I/O's, vent status/settings, TF initiation/tolerance  Reason for Assessment: Ventilator Use  73 y.o. male  Admitting Dx: cardiac arrest  ASSESSMENT: PMHx significant for MI, CVA, afib, seizures. Admitted s/p cardiac arrest.   Arctic sun protocol initiated on admission. Current temperature 33.3 degrees C.  Patient is currently intubated on ventilator support MV: 10 L/min Temp (24hrs), Avg:93.2 F (34 C), Min:90.1 F (32.3 C), Max:97.1 F (36.2 C)  Propofol: none  Nutrition Focused Physical Exam:  Subcutaneous Fat:  Orbital Region: WNL Upper Arm Region: n/a Thoracic and Lumbar Region: n/a  Muscle:  Temple Region: moderate depletion Clavicle Bone Region: moderate depletion Clavicle and Acromion Bone Region: n/a Scapular Bone Region: n/a Dorsal Hand: moderate depletion Patellar Region: n/a Anterior Thigh Region: n/a Posterior Calf Region: n/a  Edema: mild peripheral edema  Potassium low at 3.4 -> ordered for KCl Phosphorus low at 2.0 Magnesium WNL  Height: Ht Readings from Last 1 Encounters:  03/05/14 6' (1.829 m)    Weight: Wt Readings from Last 1 Encounters:  03/05/14 176 lb 5.9 oz (80 kg)    Ideal Body Weight: 178 lb/80.9 kg  % Ideal Body  Weight: 99%  Wt Readings from Last 10 Encounters:  03/05/14 176 lb 5.9 oz (80 kg)  03/05/14 176 lb 5.9 oz (80 kg)  12/01/13 194 lb 2 oz (88.055 kg)  09/01/13 198 lb 9.6 oz (90.084 kg)    Usual Body Weight: n/a  % Usual Body Weight: n/a  BMI:  Body mass index is 23.91 kg/(m^2). WNL  Estimated Nutritional Needs (calculated using 37 degrees C): Kcal: 1787 Protein: 96 - 110 g Fluid: approx 1.8 - 2 liters dailiy  Skin: intact  Diet Order: NPO  EDUCATION NEEDS: -No education needs identified at this time   Intake/Output Summary (Last 24 hours) at 03/05/14 1114 Last data filed at 03/05/14 1100  Gross per 24 hour  Intake 3267.09 ml  Output   3126 ml  Net 141.09 ml    Last BM: PTA  Labs:   Recent Labs Lab 03/05/2014 1949 03/05/14 0043 03/05/14 0300 03/05/14 0444 03/05/14 0636  NA 140 140 137 143 143  K 3.8 3.3* 3.6* 3.4* 3.4*  CL 103 100 99 101 102  CO2 20  --  22  --   --   BUN 19 14 14 13 13   CREATININE 1.11 0.80 0.75 0.70 0.80  CALCIUM 8.3*  --  8.6  --   --   MG  --   --  1.7  --   --   PHOS  --   --  2.0*  --   --   GLUCOSE 262* 198* 195* 167* 150*    CBG (last 3)   Recent Labs  03/05/14 0540 03/05/14 0740 03/05/14 0936  GLUCAP 190*  152* 94    Scheduled Meds: . antiseptic oral rinse  15 mL Mouth Rinse QID  . artificial tears  1 application Both Eyes 3 times per day  . atorvastatin  80 mg Per NG tube q1800  . chlorhexidine  15 mL Mouth Rinse BID  . insulin aspart  2-6 Units Subcutaneous 6 times per day  . ipratropium-albuterol  3 mL Nebulization Q4H  . levETIRAcetam  500 mg Intravenous Q12H  . pantoprazole (PROTONIX) IV  40 mg Intravenous QHS  . sodium chloride  3 mL Intravenous Q12H    Continuous Infusions: . cisatracurium (NIMBEX) infusion 1.495 mcg/kg/min (03/05/14 1100)  . dextrose 5 % and 0.9% NaCl 50 mL/hr at 03/05/14 1100  . fentaNYL infusion INTRAVENOUS 75 mcg/hr (03/05/14 1100)  . midazolam (VERSED) infusion 3 mg/hr (03/05/14  1100)  . norepinephrine (LEVOPHED) Adult infusion 5.013 mcg/min (03/05/14 1100)    Past Medical History  Diagnosis Date  . A-fib   . Seizures   . Stroke 2006  . Hypertension   . Hypercholesteremia   . Blind right eye   . MI (myocardial infarction)     Past Surgical History  Procedure Laterality Date  . Hernia repair      Jarold MottoSamantha Darothy Courtright MS, RD, LDN Inpatient Registered Dietitian Pager: [phone] After-hours pager: [phone]

## 2014-03-05 NOTE — ED Provider Notes (Signed)
Medical screening examination/treatment/procedure(s) were conducted as a shared visit with resident physician and myself.  I personally evaluated the patient during the encounter. The resident place a left internal jugular central line which was directly supervised by me.    EKG Interpretation  Date/Time:  Wednesday March 04 2014 19:47:54 EDT Ventricular Rate:  75 PR Interval:    QRS Duration: 152 QT Interval:  462 QTC Calculation: 516 R Axis:   119 Text Interpretation:  Atrial fibrillation LVH by voltage Prolonged QT interval Confirmed by HARRISON  MD, FORREST ([id number]) on 02-12-2014 10:37:38 PM      I examined the patient. Lungs are CTAB. Cardiac exam wnl. Abdomen soft. Pt intubated but not sedated w/out response to verbal or pain. Initiated hypothermia protocol. Several ecgs initially ordered by me d/t artifact/movement as many people were working on IV placement/placing pads and leads etc. Got a clear ecg which showed no evidence of STEMI. Plan was to get CT head and CTA to r/o PE. Place central line to initiate hypothermia protocol and call cards/CCM. Initial call to cards consultant recommended calling CCM. CCM recommended cath and a call was then placed to Interventional who will take the pt to cath lab. CT head neg, CTA neg for PE.   CRITICAL CARE Performed by: Purvis SheffieldHARRISON, FORREST, S Total critical care time: 40 min Critical care time was exclusive of separately billable procedures and treating other patients. Critical care was necessary to treat or prevent imminent or life-threatening deterioration. Critical care was time spent personally by me on the following activities: development of treatment plan with patient and/or surrogate as well as nursing, discussions with consultants, evaluation of patient's response to treatment, examination of patient, obtaining history from patient or surrogate, ordering and performing treatments and interventions, ordering and review of laboratory studies,  ordering and review of radiographic studies, pulse oximetry and re-evaluation of patient's condition.   Junius ArgyleForrest S Harrison, MD 03/05/14 [phone]

## 2014-03-05 NOTE — Progress Notes (Signed)
[facility] ICU Electrolyte Replacement Protocol  Patient Name: Kyle [name] DOB: [birth year] MRN: [id number]  Date of Service  03/05/2014   HPI/Events of Note    Recent Labs Lab 02/13/2014 1949 03/05/14 0043 03/05/14 0300 03/05/14 0444  NA 140 140 137 143  K 3.8 3.3* 3.6* 3.4*  CL 103 100 99 101  CO2 20  --  22  --   GLUCOSE 262* 198* 195* 167*  BUN _0 CREATININE 1.11 0.80 0.75 0.70  CALCIUM 8.3*  --  8.6  --   MG  --   --  1.7  --   PHOS  --   --  2.0*  --     Estimated Creatinine Clearance: 87.6 ml/min (by C-G formula based on Cr of 0.7).  Intake/Output     06/24 0701 - 06/25 0700   I.V. (mL/kg) 2579.3 (32.2)   NG/GT 50   IV Piggyback 100   Total Intake(mL/kg) 2729.3 (34.1)   Urine (mL/kg/hr) 2360   Other 250   Total Output 2610   Net +119.3        - I/O DETAILED x24h    Total I/O In: 2729.3 [I.V.:2579.3; NG/GT:50; IV Piggyback:100] Out: 2610 [Urine:2360; Other:250] - I/O THIS SHIFT    ASSESSMENT   eICURN Interventions  K+ 3.4  ICU Electrolyte Replacement Protocol criteria met. Labs Replaced per protocol.MD notified   ASSESSMENT: MAJOR ELECTROLYTE    Lorene Dy 03/05/2014, 5:21 AM

## 2014-03-05 NOTE — CV Procedure (Signed)
    Cardiac Catheterization Procedure Note  Name: Kyle [name] MRN: [id number] DOB: 01/16/1941  Procedure: Left Heart Cath, Selective Coronary Angiography, LV angiography  Indication: 73 yo WM with history of remote MI presents with out of hospital Vfib arrest. After stabilization in the ED urgent cardiac cath was recommended. Ecg showed a RBBB without ST elevation.   Procedural Details: The right wrist was prepped, draped, and anesthetized with 1% lidocaine. Using the modified Seldinger technique, a 6 French slender sheath was introduced into the right radial artery. 3 mg of verapamil was administered through the sheath, weight-based unfractionated heparin was administered intravenously. Standard Judkins catheters were used for selective coronary angiography and left ventriculography. Catheter exchanges were performed over an exchange length guidewire. There were no immediate procedural complications. A TR band was used for radial hemostasis at the completion of the procedure.  The patient was transferred to the post catheterization recovery area for further monitoring.  Procedural Findings: Hemodynamics: AO 176/91 mean 123 mm Hg LV 170/28 mm Hg  Coronary angiography: Coronary dominance: right  Left mainstem: There is diffuse calcification with 40% ostial left main disease. The left main tapers distally to 30%.  Left anterior descending (LAD): The LAD is a large vessel that gives rise to a moderately large diagonal branch. In the proximal LAD there is an aneurysmal segment followed immediately by a severe bifurcation stenosis 1:1:1 in the LAD and first diagonal up to 95%.   There is a moderately large ramus branch without significant disease.   Left circumflex (LCx): The LCx has mild ostial disease but otherwise has no significant stenosis.   Right coronary artery (RCA): Dominant vessel with 70% proximal stenosis. The crux of the RCA is severely diseased up to 90%. The Distal RCA is  occluded with right to right and left to right collaterals.   Left ventriculography: Left ventricular systolic function is abnormal, LV size is increased with extensive inferior akinesis and global hypokinesis. LVEF is estimated at 25%, there is mild to moderate mitral regurgitation   Final Conclusions:   1. Severe 2 vessel obstructive CAD. I suspect the distal RCA occlusion is old. There is very complex critical LAD/diagonal bifurcation disease. 2. Severe LV dysfunction. 3. Elevated LV filling pressures.   Recommendations: Initially will need to treat medically with ventilator support and cooling protocol per CCM. ASA, beta blocker, and statin therapy. I am hesitant to start IV heparin given his thrombocytopenia. Will need to follow this closely. If patient has significant neurologic recovery post arrest he will need to be considered for revascularization with CABG. He is not a candidate for PCI.  Peter SwazilandJordan, MDFACC  03/05/2014, 12:09 AM

## 2014-03-05 NOTE — Progress Notes (Signed)
SUBJECTIVE: Intubated, sedated.   BP 142/89  Pulse 43  Temp(Src) 91 F (32.8 C) (Core (Comment))  Resp 18  Ht 6' (1.829 m)  Wt 176 lb 5.9 oz (80 kg)  BMI 23.91 kg/m2  SpO2 100%  Intake/Output Summary (Last 24 hours) at 03/05/14 0759 Last data filed at 03/05/14 0700  Gross per 24 hour  Intake 2946.29 ml  Output   2860 ml  Net  86.29 ml    PHYSICAL EXAM General: Intubated, sedated.   Neck: No JVD. No masses noted.  Lungs: Mechanical breath sounds bilaterally.   Heart: Irregular with no murmurs noted. Abdomen: Bowel sounds are present. Soft, non-tender.  Extremities: No lower extremity edema.   LABS: Basic Metabolic Panel:  Recent Labs  16/06/9605/30/2015 1949 03/05/14 0043 03/05/14 0300 03/05/14 0444 03/05/14 0636  NA 140 140 137 143 143  K 3.8 3.3* 3.6* 3.4* 3.4*  CL 103 100 99 101 102  CO2 20  --  22  --   --   GLUCOSE 262* 198* 195* 167* 150*  BUN 19 14 14 13 13   CREATININE 1.11 0.80 0.75 0.70 0.80  CALCIUM 8.3*  --  8.6  --   --   MG  --   --  1.7  --   --   PHOS  --   --  2.0*  --   --    CBC:  Recent Labs  02/26/2014 1949  03/05/14 0300 03/05/14 0444 03/05/14 0636  WBC 5.5  --  5.6  --   --   HGB 13.9  < > 14.5 13.9 15.3  HCT 38.7*  < > 41.6 41.0 45.0  MCV 93.9  --  93.1  --   --   PLT 59*  --  49*  --   --   < > = values in this interval not displayed. Cardiac Enzymes:  Recent Labs  03/07/2014 1949  TROPONINI <0.30   Current Meds: . antiseptic oral rinse  15 mL Mouth Rinse QID  . artificial tears  1 application Both Eyes 3 times per day  . atorvastatin  80 mg Per NG tube q1800  . chlorhexidine  15 mL Mouth Rinse BID  . insulin aspart  2-6 Units Subcutaneous 6 times per day  . ipratropium-albuterol  3 mL Nebulization Q4H  . levETIRAcetam  500 mg Intravenous Q12H  . pantoprazole (PROTONIX) IV  40 mg Intravenous QHS  . potassium chloride  20 mEq Per Tube Q4H  . sodium chloride  3 mL Intravenous Q12H   Cardiac cath 03/05/14: Left mainstem:  There is diffuse calcification with 40% ostial left main disease. The left main tapers distally to 30%.  Left anterior descending (LAD): The LAD is a large vessel that gives rise to a moderately large diagonal branch. In the proximal LAD there is an aneurysmal segment followed immediately by a severe bifurcation stenosis 1:1:1 in the LAD and first diagonal up to 95%.  There is a moderately large ramus branch without significant disease.  Left circumflex (LCx): The LCx has mild ostial disease but otherwise has no significant stenosis.  Right coronary artery (RCA): Dominant vessel with 70% proximal stenosis. The crux of the RCA is severely diseased up to 90%. The Distal RCA is occluded with right to right and left to right collaterals.  Left ventriculography: Left ventricular systolic function is abnormal, LV size is increased with extensive inferior akinesis and global hypokinesis. LVEF is estimated at 25%, there is mild to  moderate mitral regurgitation   ASSESSMENT AND PLAN:  1. Ventricular fibrillation arrest, out of hospital: Witnessed arrest at home. Bystander CPR. Admitted by CCM. Emergent cardiac cath per Dr. SwazilandJordan at midnight. Pt found to have high grade CAD with complex disease involving the LAD/Diagonal bifurcation and severe disease in the RCA. Cooling protocol per PCCM  2. CAD: Severe double vessel disease by cath 03/05/14. His anatomy is not favorable for PCI. He will need consideration for CABG if he recovers neurological function. He is only a statin at this time. He is not on heparin because of thrombocytopenia. He has received ASA (rectal). Would recommend daily ASA.  LVEF is 25% by LV gram. Echo later today.   3. Ischemic cardiomyopathy: As above. Will follow for now.    4. Atrial fib: Bradycardic. Hemodynamically stable with minimal pressor support. He is on Diltiazem at home and this has been held but likely still contributing to his bradycardia. If he remains bradycardic and there  are issues with hypotension, can place temporary transvenous pacing wire.   MCALHANY,CHRISTOPHER  6/25/20157:59 AM

## 2014-03-05 NOTE — Progress Notes (Signed)
ARTERIAL LINE:  Two RT's attempted to insert arterial line and was unable to thread. RT spoke with Dr. Craige CottaSood and he stated to not continue to try at this time. RT will continue to monitor.

## 2014-03-05 NOTE — Care Management Note (Addendum)
    Page 1 of 1   03/16/2014     5:01:37 PM CARE MANAGEMENT NOTE 03/16/2014  Patient:  Kyle [name],Kyle   Account Number:  [id number]  Date Initiated:  03/05/2014  Documentation initiated by:  Junius CreamerWELL,DEBBIE  Subjective/Objective Assessment:   adm w cardiac arrest-vent     Action/Plan:   lives w fam, pcp dr Fayrene Fearingjames little   Anticipated DC Date:     Anticipated DC Plan:  LONG TERM ACUTE CARE (LTAC)         Choice offered to / List presented to:             Status of service:   Medicare Important Message given?  YES (If response is "NO", the following Medicare IM given date fields will be blank) Date Medicare IM given:  03/13/2014 Medicare IM given by:  Junius CreamerWELL,DEBBIE Date Additional Medicare IM given:   Additional Medicare IM given by:    Discharge Disposition:    Per UR Regulation:  Reviewed for med. necessity/level of care/duration of stay  If discussed at Long Length of Stay Meetings, dates discussed:    Comments:  03/16/14 Sidney AceJulie Sula Fetterly, RN, BSN [phone] Pt with anoxic brain injury.  Family with active discussions regarding trach vs comfort measures.  Will follow progress.  03/09/14 1356 Henrietta Mayo RN MSN BSN CCM Remains on vent, amio/levophed gtts, IV lasix.

## 2014-03-06 ENCOUNTER — Inpatient Hospital Stay (HOSPITAL_COMMUNITY): Payer: Medicare Other

## 2014-03-06 DIAGNOSIS — I2510 Atherosclerotic heart disease of native coronary artery without angina pectoris: Secondary | ICD-10-CM

## 2014-03-06 DIAGNOSIS — I200 Unstable angina: Secondary | ICD-10-CM

## 2014-03-06 LAB — BLOOD GAS, ARTERIAL
Acid-base deficit: 4 mmol/L — ABNORMAL HIGH (ref 0.0–2.0)
Bicarbonate: 20.4 mEq/L (ref 20.0–24.0)
Drawn by: 10006
FIO2: 0.3 %
MECHVT: 550 mL
O2 SAT: 98.8 %
PCO2 ART: 31.6 mmHg — AB (ref 35.0–45.0)
PEEP/CPAP: 5 cmH2O
PH ART: 7.41 (ref 7.350–7.450)
PO2 ART: 113 mmHg — AB (ref 80.0–100.0)
Patient temperature: 93.6
RATE: 18 resp/min
TCO2: 21.5 mmol/L (ref 0–100)

## 2014-03-06 LAB — BASIC METABOLIC PANEL
BUN: 14 mg/dL (ref 6–23)
BUN: 14 mg/dL (ref 6–23)
CALCIUM: 8.5 mg/dL (ref 8.4–10.5)
CO2: 21 mEq/L (ref 19–32)
CO2: 20 mEq/L (ref 19–32)
Calcium: 8.3 mg/dL — ABNORMAL LOW (ref 8.4–10.5)
Chloride: 106 mEq/L (ref 96–112)
Chloride: 105 mEq/L (ref 96–112)
Creatinine, Ser: 0.72 mg/dL (ref 0.50–1.35)
Creatinine, Ser: 0.74 mg/dL (ref 0.50–1.35)
GFR calc Af Amer: >90 mL/min (ref >90)
GFR calc Af Amer: >90 mL/min (ref >90)
GFR calc non Af Amer: 88 mL/min — ABNORMAL LOW (ref >90)
GFR, EST NON AFRICAN AMERICAN: 89 mL/min — AB (ref >90)
GLUCOSE: 154 mg/dL — AB (ref 70–99)
Glucose, Bld: 168 mg/dL — ABNORMAL HIGH (ref 70–99)
POTASSIUM: 3.9 meq/L (ref 3.7–5.3)
Potassium: 3.9 mEq/L (ref 3.7–5.3)
SODIUM: 140 meq/L (ref 137–147)
SODIUM: 141 meq/L (ref 137–147)

## 2014-03-06 LAB — GLUCOSE, CAPILLARY
GLUCOSE-CAPILLARY: 107 mg/dL — AB (ref 70–99)
GLUCOSE-CAPILLARY: 137 mg/dL — AB (ref 70–99)
Glucose-Capillary: 120 mg/dL — ABNORMAL HIGH (ref 70–99)
Glucose-Capillary: 113 mg/dL — ABNORMAL HIGH (ref 70–99)
Glucose-Capillary: 90 mg/dL (ref 70–99)
Glucose-Capillary: 140 mg/dL — ABNORMAL HIGH (ref 70–99)

## 2014-03-06 LAB — PHOSPHORUS: Phosphorus: 3.5 mg/dL (ref 2.3–4.6)

## 2014-03-06 LAB — CBC
HCT: 40.2 % (ref 39.0–52.0)
Hemoglobin: 14.1 g/dL (ref 13.0–17.0)
MCH: 32 pg (ref 26.0–34.0)
MCHC: 35.1 g/dL (ref 30.0–36.0)
MCV: 91.2 fL (ref 78.0–100.0)
PLATELETS: 62 10*3/uL — AB (ref 150–400)
RBC: 4.41 MIL/uL (ref 4.22–5.81)
RDW: 14.1 % (ref 11.5–15.5)
WBC: 6.9 10*3/uL (ref 4.0–10.5)

## 2014-03-06 LAB — MAGNESIUM: MAGNESIUM: 1.6 mg/dL (ref 1.5–2.5)

## 2014-03-06 LAB — LACTIC ACID, PLASMA: Lactic Acid, Venous: 2.7 mmol/L — ABNORMAL HIGH (ref 0.5–2.2)

## 2014-03-06 MED ORDER — AMIODARONE LOAD VIA INFUSION
150.0000 mg | Freq: Once | INTRAVENOUS | Status: DC
  Filled 2014-03-06: qty 83.34

## 2014-03-06 MED ORDER — ASPIRIN 81 MG PO CHEW
81.0000 mg | CHEWABLE_TABLET | Freq: Every day | ORAL | Status: DC
  Administered 2014-03-06 – 2014-03-16 (×11): 81 mg
  Filled 2014-03-06 (×12): qty 1

## 2014-03-06 MED ORDER — IPRATROPIUM-ALBUTEROL 0.5-2.5 (3) MG/3ML IN SOLN
3.0000 mL | Freq: Four times a day (QID) | RESPIRATORY_TRACT | Status: DC
  Administered 2014-03-06 – 2014-03-16 (×41): 3 mL via RESPIRATORY_TRACT
  Filled 2014-03-06 (×41): qty 3

## 2014-03-06 MED ORDER — INSULIN ASPART 100 UNIT/ML ~~LOC~~ SOLN
0.0000 [IU] | SUBCUTANEOUS | Status: DC
  Administered 2014-03-06 – 2014-03-07 (×3): 2 [IU] via SUBCUTANEOUS
  Administered 2014-03-07: 3 [IU] via SUBCUTANEOUS
  Administered 2014-03-07 – 2014-03-08 (×2): 2 [IU] via SUBCUTANEOUS
  Administered 2014-03-08 (×2): 3 [IU] via SUBCUTANEOUS
  Administered 2014-03-08: 2 [IU] via SUBCUTANEOUS
  Administered 2014-03-08: 5 [IU] via SUBCUTANEOUS
  Administered 2014-03-08: 2 [IU] via SUBCUTANEOUS
  Administered 2014-03-09: 3 [IU] via SUBCUTANEOUS
  Administered 2014-03-09: 2 [IU] via SUBCUTANEOUS
  Administered 2014-03-09: 3 [IU] via SUBCUTANEOUS
  Administered 2014-03-09: 2 [IU] via SUBCUTANEOUS
  Administered 2014-03-09: 3 [IU] via SUBCUTANEOUS
  Administered 2014-03-10: 2 [IU] via SUBCUTANEOUS
  Administered 2014-03-10: 3 [IU] via SUBCUTANEOUS
  Administered 2014-03-10: 2 [IU] via SUBCUTANEOUS
  Administered 2014-03-10: 3 [IU] via SUBCUTANEOUS
  Administered 2014-03-10 (×2): 2 [IU] via SUBCUTANEOUS
  Administered 2014-03-11 (×2): 3 [IU] via SUBCUTANEOUS
  Administered 2014-03-11 (×2): 2 [IU] via SUBCUTANEOUS
  Administered 2014-03-11 – 2014-03-12 (×3): 3 [IU] via SUBCUTANEOUS
  Administered 2014-03-12: 2 [IU] via SUBCUTANEOUS
  Administered 2014-03-12: 3 [IU] via SUBCUTANEOUS
  Administered 2014-03-12 – 2014-03-13 (×5): 2 [IU] via SUBCUTANEOUS
  Administered 2014-03-13: 3 [IU] via SUBCUTANEOUS
  Administered 2014-03-13 – 2014-03-14 (×5): 2 [IU] via SUBCUTANEOUS
  Administered 2014-03-14: 3 [IU] via SUBCUTANEOUS
  Administered 2014-03-14 – 2014-03-16 (×3): 2 [IU] via SUBCUTANEOUS
  Administered 2014-03-16: 3 [IU] via SUBCUTANEOUS
  Administered 2014-03-16: 2 [IU] via SUBCUTANEOUS

## 2014-03-06 MED ORDER — AMIODARONE HCL IN DEXTROSE 360-4.14 MG/200ML-% IV SOLN
60.0000 mg/h | INTRAVENOUS | Status: AC
  Administered 2014-03-06: 60 mg/h via INTRAVENOUS

## 2014-03-06 MED ORDER — MAGNESIUM SULFATE 50 % IJ SOLN
3.0000 g | Freq: Once | INTRAVENOUS | Status: AC
  Administered 2014-03-06: 3 g via INTRAVENOUS
  Filled 2014-03-06: qty 6

## 2014-03-06 MED ORDER — VITAL HIGH PROTEIN PO LIQD
1000.0000 mL | ORAL | Status: DC
  Filled 2014-03-06 (×2): qty 1000

## 2014-03-06 MED ORDER — ASPIRIN 81 MG PO CHEW: 81.0000 mg | CHEWABLE_TABLET | Freq: Every day | ORAL | Status: DC

## 2014-03-06 MED ORDER — SODIUM CHLORIDE 0.9 % IV SOLN
1.0000 mg/h | INTRAVENOUS | Status: DC
  Administered 2014-03-06: 2 mg/h via INTRAVENOUS
  Filled 2014-03-06 (×2): qty 10

## 2014-03-06 MED ORDER — FENTANYL CITRATE 0.05 MG/ML IJ SOLN
25.0000 ug | INTRAMUSCULAR | Status: DC | PRN
  Administered 2014-03-10 (×2): 50 ug via INTRAVENOUS
  Filled 2014-03-06 (×2): qty 2

## 2014-03-06 MED ORDER — VITAL AF 1.2 CAL PO LIQD
1000.0000 mL | ORAL | Status: DC
  Administered 2014-03-06 – 2014-03-10 (×2): 1000 mL
  Filled 2014-03-06 (×11): qty 1000

## 2014-03-06 MED ORDER — MIDAZOLAM HCL 2 MG/2ML IJ SOLN: 1.0000 mg | INTRAMUSCULAR | Status: DC | PRN

## 2014-03-06 MED ORDER — AMIODARONE HCL IN DEXTROSE 360-4.14 MG/200ML-% IV SOLN
INTRAVENOUS | Status: AC
  Administered 2014-03-06: 60 mg/h via INTRAVENOUS
  Filled 2014-03-06: qty 200

## 2014-03-06 MED ORDER — AMIODARONE HCL IN DEXTROSE 360-4.14 MG/200ML-% IV SOLN
30.0000 mg/h | INTRAVENOUS | Status: DC
  Administered 2014-03-07 (×2): 30 mg/h via INTRAVENOUS
  Filled 2014-03-06 (×13): qty 200

## 2014-03-06 NOTE — Progress Notes (Signed)
eLink Physician-Brief Progress Note Patient Name: Kyle [name] DOB: 03/27/1941 MRN: [id number]  Date of Service  03/06/2014   HPI/Events of Note   Myoclonus and fluctuant HR, BP stable.  eICU Interventions  Versed drip ordered.   Intervention Category Major Interventions: Other:  YACOUB,WESAM 03/06/2014, 5:33 PM

## 2014-03-06 NOTE — Progress Notes (Signed)
Pt is now being rewarmed. Arctic sun is set to warm automatically.

## 2014-03-06 NOTE — Progress Notes (Signed)
PULMONARY / CRITICAL CARE MEDICINE   Name: Kyle MornJerry Beumer MRN: [id number] DOB: 11/28/1940    ADMISSION DATE:  03-Jan-2014 CONSULTATION DATE:  03-Jan-2014  REFERRING MD :  EDP PRIMARY SERVICE: PCCM  CHIEF COMPLAINT:  Cardiac arrest  BRIEF PATIENT DESCRIPTION: 73 year old male with PMH MI, CVA, A-fib, and seizures suffered witnessed cardiac arrest 6/24.  Initial rhythm VF. ROSC achieved with defibrillation, CPR and amiodarone. In ED found to have ECG changes. PCCM asked to see for admission.   SIGNIFICANT EVENTS / STUDIES:  6/24 adm after VF arrest. Hypothermia initiated 6/24 CT head: No acute intracranial process. Remote CVA 6/24 CTA chest - No pulmonary emboli. 10 mm nodule in RUL. Multiple bilateral anterior rib fractures. 6/24 LHC: severe 2 vessel obstructive CAD - not amenable to PCI. Severe LV dysfunction (LVEF 25%). Elevated LV pressures. Rec med rx. Consider CABG in future 6/25 TTE:  LVEF 30-35%, mod dil LV, akinesis of entire inf myocardium, basal-mid inferolateral myocardium, sclerosis of aortic valve, mod dil aorta and mod dil LA  LINES / TUBES: R - port-a-cath (chronic) LIJ CVL 6/24 >>> ETT 6/24 >>>  CULTURES: Urine 6/24 >>> NEG MRSA PCR 6/24 >>> NEG  ANTIBIOTICS: None  SUBJECTIVE:  Patient is intubated, sedated, paralyzed.   VITAL SIGNS: Temp:  [90.9 F (32.7 C)-94.8 F (34.9 C)] 94.8 F (34.9 C) (06/26 0700) Pulse Rate:  [34-70] 55 (06/26 0700) Resp:  [16-18] 18 (06/26 0700) BP: (108-149)/(60-100) 149/81 mmHg (06/26 0600) SpO2:  [99 %-100 %] 100 % (06/26 0720) Arterial Line BP: (107-138)/(62-88) 121/65 mmHg (06/26 0700) FiO2 (%):  [30 %-50 %] 30 % (06/26 0720) Weight:  [85.01 kg (187 lb 6.6 oz)] 85.01 kg (187 lb 6.6 oz) (06/26 0300)  HEMODYNAMICS: CVP:  [0 mmHg-8 mmHg] 3 mmHg  VENTILATOR SETTINGS: Vent Mode:  [-] PRVC FiO2 (%):  [30 %-50 %] 30 % Set Rate:  [18 bmp] 18 bmp Vt Set:  [550 mL] 550 mL PEEP:  [5 cmH20] 5 cmH20 Plateau Pressure:  [14 cmH20]  14 cmH20  INTAKE / OUTPUT: Intake/Output     06/25 0701 - 06/26 0700 06/26 0701 - 06/27 0700   I.V. (mL/kg) 1994.2 (23.5)    NG/GT 30    IV Piggyback 205    Total Intake(mL/kg) 2229.2 (26.2)    Urine (mL/kg/hr) 706 (0.3)    Other     Total Output 706     Net +1523.2           PHYSICAL EXAMINATION: General: sedated, paralyzed.  Neuro: limited exam due to NMBs  HEENT:  [place]/AT, pupils asymmetric with R cataract Cardiovascular: IRIR, no M noted Lungs:  Clear anteriorly Abdomen:  Soft, non-distended. BS + Ext. Mild peripheral edema.  LABS: I have reviewed all of today's lab results. Relevant abnormalities are discussed in the A/P section CXR: CM, NAD  ASSESSMENT / PLAN:  PULMONARY A: Acute respiratory failure 2nd to cardiac arrest H/o COPD Lung nodule on CT P:   Cont full vent support - settings reviewed and/or adjusted Cont vent bundle Daily SBT if/when meets criteria  CARDIOVASCULAR A:  VF arrest  Hypertension Atrial fib Critical CAD  P:  Change MAP goal to 65 mmHg Wean NE to off as tolerated Cards following Consider CABG if neuro recovery good  RENAL A:   Lactic acidosis, resolved P:   Monitor BMET intermittently Monitor I/Os Correct electrolytes as indicated  GASTROINTESTINAL A:   No issues P:   SUP: IV PPI Begin TFs 6/26  HEMATOLOGIC  A:   Chronic warfarin therapy for CAF Thrombocytopenia of uncertain etiology P:  DVT px: SCDs Monitor CBC intermittently Transfuse per usual ICU guidelines Holding warfarin  INFECTIOUS A:  No overt infections P:   Micro and abx as above Monitor WBC, temp  ENDOCRINE A:   Hyperglycemia without prior DM P:   Cont SSI  NEUROLOGIC A:   Acute encephalopathy, post anoxic H/o seizures, CVA P:   DC continuous sedatives post rewarming PRN fentanyl and midaz ordered Daily WUA Further neuro eval as warranted after rewarmed  Elenore PaddyStephanie M. Reese, PA-S   No family available to update  35 mins CCM  time   Billy Fischeravid Simonds, MD ; [facility] service Mobile [phone].  After 5:30 PM or weekends, call [phone]

## 2014-03-06 NOTE — Progress Notes (Signed)
NUTRITION FOLLOW-UP/CONSULT  DOCUMENTATION CODES Per approved criteria  -Not Applicable   INTERVENTION: Once patient is re-warmed, initiate Vital AF 1.2 @ 25 ml/hr via OGT and increase by 10 ml every 4 hours to goal rate of 65 ml/hr. Tube feeding regimen will provide 1872 kcal (102% of needs), 117 grams of protein (100% of needs), and 1265 ml of H2O.  RD to continue to follow nutrition care plan.  NUTRITION DIAGNOSIS: Inadequate oral intake related to inability to eat as evidenced by NPO status. Ongoing.  Goal: Initiation of nutrition support within 24-48 hours of intubation and once pt re-warmed. Met. Intake to meet >90% of estimated nutrition needs. Unmet.  Monitor:  weight trends, lab trends, I/O's, vent status/settings, TF initiation/tolerance  ASSESSMENT: PMHx significant for MI, CVA, afib, seizures. Admitted s/p cardiac arrest.   Arctic sun protocol initiated on admission. Current temperature 35.5 degrees C. RN reports pt should be at 37 degrees C in approximately 1 hour.  Patient is currently intubated on ventilator support MV: 9.9 L/min Temp (24hrs), Avg:92.3 F (33.5 C), Min:90.9 F (32.7 C), Max:95.9 F (35.5 C)  RD consulted to initiate feeding. Discussed with RN. RD to have TF start at 12pm to ensure pt is re-warmed.  Potassium now WNL Phosphorus now WNL Magnesium WNL  Height: Ht Readings from Last 1 Encounters:  03/05/14 6' (1.829 m)    Weight: Wt Readings from Last 1 Encounters:  03/06/14 187 lb 6.6 oz (85.01 kg)  Admit wt 194 lb  BMI:  Body mass index is 25.41 kg/(m^2). WNL  Estimated Nutritional Needs (calculated using 37 degrees C): Kcal: 7048 Protein: 96 - 110 g Fluid: approx 1.8 - 2 liters dailiy  Skin: intact  Diet Order:   NPO   Intake/Output Summary (Last 24 hours) at 03/06/14 0937 Last data filed at 03/06/14 0800  Gross per 24 hour  Intake 1924.81 ml  Output    511 ml  Net 1413.81 ml    Last BM: PTA  Labs:   Recent  Labs Lab 03/05/14 0043 03/05/14 0300  03/05/14 2200 03/06/14 0230 03/06/14 0400  NA 140 137  < > 137 140 141  K 3.3* 3.6*  < > 3.6* 3.9 3.9  CL 100 99  < > 102 106 105  CO2  --  22  < > 20 21 20   BUN 14 14  < > 14 14 14   CREATININE 0.80 0.75  < > 0.71 0.72 0.74  CALCIUM  --  8.6  < > 8.4 8.3* 8.5  MG  --  1.7  --   --   --  1.6  PHOS  --  2.0*  --   --   --  3.5  GLUCOSE 198* 195*  < > 176* 168* 154*  < > = values in this interval not displayed.  CBG (last 3)   Recent Labs  03/05/14 2336 03/06/14 0402 03/06/14 0733  GLUCAP 159* 120* 113*    Scheduled Meds: . antiseptic oral rinse  15 mL Mouth Rinse QID  . aspirin  81 mg Per Tube Daily  . atorvastatin  80 mg Per NG tube q1800  . chlorhexidine  15 mL Mouth Rinse BID  . feeding supplement (VITAL HIGH PROTEIN)  1,000 mL Per Tube Q24H  . insulin aspart  0-15 Units Subcutaneous 6 times per day  . ipratropium-albuterol  3 mL Nebulization Q6H  . levETIRAcetam  500 mg Intravenous Q12H  . pantoprazole (PROTONIX) IV  40 mg Intravenous QHS  Continuous Infusions: . cisatracurium (NIMBEX) infusion 1.5 mcg/kg/min (03/05/14 2026)  . dextrose 5 % and 0.9% NaCl 50 mL/hr at 03/06/14 0611  . fentaNYL infusion INTRAVENOUS 75 mcg/hr (03/05/14 2224)  . midazolam (VERSED) infusion 3 mg/hr (03/06/14 0138)  . norepinephrine (LEVOPHED) Adult infusion 7 mcg/min (03/06/14 0500)    Inda Coke MS, RD, LDN Inpatient Registered Dietitian Pager: [phone] After-hours pager: [phone]

## 2014-03-06 NOTE — Progress Notes (Signed)
EKG reviewed with Dr. Gala RomneyBensimhon, suspect atrial flutter/atrial tach - wants to start amiodarone with usual 150mg  bolus then drip. Mg 1.6 today - will rx 3g mag sulfate (infused over >120 min). Dayna Dunn PA-C

## 2014-03-06 NOTE — Progress Notes (Signed)
SUBJECTIVE: Intubated. Sedated.   Tele: Atrial fib, rate 70 bpm  BP 149/81  Pulse 55  Temp(Src) 94.8 F (34.9 C) (Core (Comment))  Resp 18  Ht 6' (1.829 m)  Wt 187 lb 6.6 oz (85.01 kg)  BMI 25.41 kg/m2  SpO2 100%  Intake/Output Summary (Last 24 hours) at 03/06/14 0719 Last data filed at 03/06/14 0700  Gross per 24 hour  Intake 2229.21 ml  Output    706 ml  Net 1523.21 ml    PHYSICAL EXAM General: Intubated, sedated.  Neck: No JVD. No masses noted.  Lungs: Mechanical breath sounds bilaterally.  Heart: Irregular with no murmurs noted.  Abdomen: Bowel sounds are present. Soft, non-tender.  Extremities: No lower extremity edema.   LABS: Basic Metabolic Panel:  Recent Labs  16/06/9605/25/15 0300  03/06/14 0230 03/06/14 0400  NA 137  < > 140 141  K 3.6*  < > 3.9 3.9  CL 99  < > 106 105  CO2 22  < > 21 20  GLUCOSE 195*  < > 168* 154*  BUN 14  < > 14 14  CREATININE 0.75  < > 0.72 0.74  CALCIUM 8.6  < > 8.3* 8.5  MG 1.7  --   --  1.6  PHOS 2.0*  --   --  3.5  < > = values in this interval not displayed. CBC:  Recent Labs  03/05/14 0300  03/05/14 0636 03/06/14 0400  WBC 5.6  --   --  6.9  HGB 14.5  < > 15.3 14.1  HCT 41.6  < > 45.0 40.2  MCV 93.1  --   --  91.2  PLT 49*  --   --  62*  < > = values in this interval not displayed. Cardiac Enzymes:  Recent Labs  02/28/2014 1949  TROPONINI <0.30   Current Meds: . antiseptic oral rinse  15 mL Mouth Rinse QID  . artificial tears  1 application Both Eyes 3 times per day  . atorvastatin  80 mg Per NG tube q1800  . chlorhexidine  15 mL Mouth Rinse BID  . insulin aspart  2-6 Units Subcutaneous 6 times per day  . ipratropium-albuterol  3 mL Nebulization Q4H  . levETIRAcetam  500 mg Intravenous Q12H  . pantoprazole (PROTONIX) IV  40 mg Intravenous QHS  . sodium chloride  3 mL Intravenous Q12H   Cardiac cath 03/05/14:  Left mainstem: There is diffuse calcification with 40% ostial left main disease. The left main  tapers distally to 30%.  Left anterior descending (LAD): The LAD is a large vessel that gives rise to a moderately large diagonal branch. In the proximal LAD there is an aneurysmal segment followed immediately by a severe bifurcation stenosis 1:1:1 in the LAD and first diagonal up to 95%.  There is a moderately large ramus branch without significant disease.  Left circumflex (LCx): The LCx has mild ostial disease but otherwise has no significant stenosis.  Right coronary artery (RCA): Dominant vessel with 70% proximal stenosis. The crux of the RCA is severely diseased up to 90%. The Distal RCA is occluded with right to right and left to right collaterals.  Left ventriculography: Left ventricular systolic function is abnormal, LV size is increased with extensive inferior akinesis and global hypokinesis. LVEF is estimated at 25%, there is mild to moderate mitral regurgitation   Echo 03/05/14: Left ventricle: The cavity size was moderately dilated. Systolic function was moderately to severely reduced. The estimated ejection  fraction was in the range of 30% to 35%. There is akinesis of the entireinferior myocardium. There is akinesis of the basal-midinferolateral myocardium. - Aortic valve: Moderate thickening and calcification, consistent with sclerosis. There was trivial regurgitation. - Ascending aorta: The ascending aorta was mildly dilated. - Left atrium: The atrium was mildly dilated.  ASSESSMENT AND PLAN:   1. Ventricular fibrillation arrest, out of hospital: Witnessed arrest at home. Likely primary arrythmia event with ischemic cardiomyopathy. Bystander CPR. Admitted by CCM. Emergent cardiac cath per Dr. SwazilandJordan at midnight. Pt found to have high grade CAD with complex disease involving the LAD/Diagonal bifurcation and severe disease in the RCA but these lesions were not acute. No PCI performed. Cooling protocol per PCCM with rewarming this am.   2. CAD: Severe double vessel disease by cath  03/05/14. His anatomy is not favorable for PCI. He will need consideration for CABG if he recovers neurological function. He is only a statin at this time. He is not on heparin because of thrombocytopenia. Will start ASA. LVEF is 25% by LV gram. Echo with LVEF=30-35%.   3. Ischemic cardiomyopathy: As above. Will follow for now. He will need an ICD following revascularization with CABG.   4. Atrial fib: Bradycardic, stable. Hemodynamically stable with minimal pressor support. Home Diltiazem has been held. If he remains bradycardic and there are issues with hypotension, can place temporary transvenous pacing wire.      MCALHANY,CHRISTOPHER  6/26/20157:19 AM

## 2014-03-07 ENCOUNTER — Inpatient Hospital Stay (HOSPITAL_COMMUNITY): Payer: Medicare Other

## 2014-03-07 DIAGNOSIS — I209 Angina pectoris, unspecified: Secondary | ICD-10-CM

## 2014-03-07 DIAGNOSIS — R569 Unspecified convulsions: Secondary | ICD-10-CM

## 2014-03-07 DIAGNOSIS — G931 Anoxic brain damage, not elsewhere classified: Secondary | ICD-10-CM

## 2014-03-07 LAB — GLUCOSE, CAPILLARY
GLUCOSE-CAPILLARY: 102 mg/dL — AB (ref 70–99)
GLUCOSE-CAPILLARY: 117 mg/dL — AB (ref 70–99)
GLUCOSE-CAPILLARY: 131 mg/dL — AB (ref 70–99)
GLUCOSE-CAPILLARY: 133 mg/dL — AB (ref 70–99)
Glucose-Capillary: 115 mg/dL — ABNORMAL HIGH (ref 70–99)
Glucose-Capillary: 149 mg/dL — ABNORMAL HIGH (ref 70–99)

## 2014-03-07 LAB — BASIC METABOLIC PANEL
BUN: 19 mg/dL (ref 6–23)
CALCIUM: 8.2 mg/dL — AB (ref 8.4–10.5)
CO2: 21 mEq/L (ref 19–32)
Chloride: 106 mEq/L (ref 96–112)
Creatinine, Ser: 0.98 mg/dL (ref 0.50–1.35)
GFR calc Af Amer: >90 mL/min (ref >90)
GFR, EST NON AFRICAN AMERICAN: 78 mL/min — AB (ref >90)
Glucose, Bld: 151 mg/dL — ABNORMAL HIGH (ref 70–99)
POTASSIUM: 4.2 meq/L (ref 3.7–5.3)
Sodium: 139 mEq/L (ref 137–147)

## 2014-03-07 LAB — TRIGLYCERIDES: Triglycerides: 69 mg/dL (ref <150)

## 2014-03-07 LAB — CBC
HCT: 37.5 % — ABNORMAL LOW (ref 39.0–52.0)
Hemoglobin: 12.7 g/dL — ABNORMAL LOW (ref 13.0–17.0)
MCH: 32.1 pg (ref 26.0–34.0)
MCHC: 33.9 g/dL (ref 30.0–36.0)
MCV: 94.7 fL (ref 78.0–100.0)
Platelets: 62 10*3/uL — ABNORMAL LOW (ref 150–400)
RBC: 3.96 MIL/uL — ABNORMAL LOW (ref 4.22–5.81)
RDW: 14.6 % (ref 11.5–15.5)
WBC: 6.3 10*3/uL (ref 4.0–10.5)

## 2014-03-07 LAB — MAGNESIUM: Magnesium: 2.1 mg/dL (ref 1.5–2.5)

## 2014-03-07 LAB — LACTIC ACID, PLASMA: Lactic Acid, Venous: 1.1 mmol/L (ref 0.5–2.2)

## 2014-03-07 LAB — PHOSPHORUS: Phosphorus: 1.8 mg/dL — ABNORMAL LOW (ref 2.3–4.6)

## 2014-03-07 MED ORDER — LISINOPRIL 5 MG PO TABS
5.0000 mg | ORAL_TABLET | Freq: Every day | ORAL | Status: DC
  Administered 2014-03-07 – 2014-03-08 (×2): 5 mg via ORAL
  Filled 2014-03-07 (×4): qty 1

## 2014-03-07 MED ORDER — HEPARIN SODIUM (PORCINE) 5000 UNIT/ML IJ SOLN
5000.0000 [IU] | Freq: Three times a day (TID) | INTRAMUSCULAR | Status: DC
  Administered 2014-03-07 – 2014-03-08 (×3): 5000 [IU] via SUBCUTANEOUS
  Filled 2014-03-07 (×6): qty 1

## 2014-03-07 MED ORDER — PROPOFOL 10 MG/ML IV EMUL
0.0000 ug/kg/min | INTRAVENOUS | Status: DC
  Administered 2014-03-07: 5 ug/kg/min via INTRAVENOUS
  Administered 2014-03-08: 10 ug/kg/min via INTRAVENOUS
  Filled 2014-03-07 (×3): qty 100

## 2014-03-07 NOTE — Progress Notes (Addendum)
SUBJECTIVE:  Remains Intubated. Sedated. Had AFL/AT yesterday and started on amio, Continue with myoclonus  Tele: Atrial fib, rate 70 bpm  BP 134/61  Pulse 89  Temp(Src) 100.9 F (38.3 C) (Oral)  Resp 19  Ht 6' (1.829 m)  Wt 83.51 kg (184 lb 1.7 oz)  BMI 24.96 kg/m2  SpO2 97%  Intake/Output Summary (Last 24 hours) at 03/07/14 0948 Last data filed at 03/07/14 0700  Gross per 24 hour  Intake 2437.89 ml  Output    380 ml  Net 2057.89 ml    PHYSICAL EXAM General: Intubated. Does not respond to voice or pain. Involuntary eyelid twitching Neck: No JVD. No masses noted.  Lungs: Mechanical breath sounds bilaterally.  Heart: Irregular with no murmurs noted.  Abdomen: Bowel sounds are present. Soft, non-tender.  Extremities: No lower extremity edema.   LABS: Basic Metabolic Panel:  Recent Labs  45/40/9806/25/15 0300  03/06/14 0400 03/07/14 0437  NA 137  < > 141 139  K 3.6*  < > 3.9 4.2  CL 99  < > 105 106  CO2 22  < > 20 21  GLUCOSE 195*  < > 154* 151*  BUN 14  < > 14 19  CREATININE 0.75  < > 0.74 0.98  CALCIUM 8.6  < > 8.5 8.2*  MG 1.7  --  1.6  --   PHOS 2.0*  --  3.5  --   < > = values in this interval not displayed. CBC:  Recent Labs  03/06/14 0400 03/07/14 0437  WBC 6.9 6.3  HGB 14.1 12.7*  HCT 40.2 37.5*  MCV 91.2 94.7  PLT 62* 62*   Cardiac Enzymes:  Recent Labs  04/03/2014 1949  TROPONINI <0.30   Current Meds: . amiodarone  150 mg Intravenous Once  . antiseptic oral rinse  15 mL Mouth Rinse QID  . aspirin  81 mg Per Tube Daily  . atorvastatin  80 mg Per NG tube q1800  . chlorhexidine  15 mL Mouth Rinse BID  . insulin aspart  0-15 Units Subcutaneous 6 times per day  . ipratropium-albuterol  3 mL Nebulization Q6H  . levETIRAcetam  500 mg Intravenous Q12H  . pantoprazole (PROTONIX) IV  40 mg Intravenous QHS   Cardiac cath 03/05/14:  Left mainstem: There is diffuse calcification with 40% ostial left main disease. The left main tapers distally  to 30%.  Left anterior descending (LAD): The LAD is a large vessel that gives rise to a moderately large diagonal branch. In the proximal LAD there is an aneurysmal segment followed immediately by a severe bifurcation stenosis 1:1:1 in the LAD and first diagonal up to 95%.  There is a moderately large ramus branch without significant disease.  Left circumflex (LCx): The LCx has mild ostial disease but otherwise has no significant stenosis.  Right coronary artery (RCA): Dominant vessel with 70% proximal stenosis. The crux of the RCA is severely diseased up to 90%. The Distal RCA is occluded with right to right and left to right collaterals.  Left ventriculography: Left ventricular systolic function is abnormal, LV size is increased with extensive inferior akinesis and global hypokinesis. LVEF is estimated at 25%, there is mild to moderate mitral regurgitation   Echo 03/05/14: Left ventricle: The cavity size was moderately dilated. Systolic function was moderately to severely reduced. The estimated ejection fraction was in the range of 30% to 35%. There is akinesis of the entireinferior myocardium. There is akinesis of the basal-midinferolateral myocardium. -  Aortic valve: Moderate thickening and calcification, consistent with sclerosis. There was trivial regurgitation. - Ascending aorta: The ascending aorta was mildly dilated. - Left atrium: The atrium was mildly dilated.  ASSESSMENT AND PLAN:   1. Ventricular fibrillation arrest, out of hospital: Witnessed arrest at home. Likely primary arrythmia event with ischemic cardiomyopathy. Bystander CPR. Admitted by CCM. Pt found to have high grade CAD with complex disease involving the LAD/Diagonal bifurcation and severe disease in the RCA but these lesions were not acute. No PCI performed. Cooling protocol per PCCM - now rewarmed  2. CAD: Severe double vessel disease by cath 03/05/14. His anatomy is not favorable for PCI. He will need consideration for  CABG if he recovers neurological function.On ASA. b-blocker stopped due to severe bradycardia earlier. LVEF is 25% by LV gram. Echo with LVEF=30-35%.   3. Ischemic cardiomyopathy: As above. Will follow for now. No signs of ADHF. Start low dose ACE given HTN.   4. Atrial fib/AFL: had episode of RVR 6/26. Now on amio. Will repeat head CT. If no acute CVA will start heparin. Will start DVT prophylaxis dose now.   5. Probable severe anoxic brain injury: Neurology has been consulted. Prognosis appears poor. Will repeat CT.  6. H/o CVA  Discussed with his pastor at bedside.   The patient is critically ill with multiple organ systems failure and requires high complexity decision making for assessment and support, frequent evaluation and titration of therapies, application of advanced monitoring technologies and extensive interpretation of multiple databases.   Critical Care Time devoted to patient care services described in this note is 35 Minutes.    Arvilla Meresaniel Bensimhon MD  6/27/20159:48 AM

## 2014-03-07 NOTE — Progress Notes (Signed)
RT note: ETT pulled back to 25cm @lip .  Patient has 7.0 ETT and with previous advancement to 28cm per CXR the ETT was meeting pressure with the tube holder causing the connection end to come loose and break circuit from the ventilator.  1.25cm was cut from the end of ETT for better security of connector end per [facility] MD.

## 2014-03-07 NOTE — Progress Notes (Signed)
PULMONARY / CRITICAL CARE MEDICINE   Name: Kyle [name] MRN: [id number] DOB: 26-Mar-1941    ADMISSION DATE:  02/20/2014 CONSULTATION DATE:  02/26/2014  REFERRING MD :  EDP PRIMARY SERVICE: PCCM  CHIEF COMPLAINT:  Cardiac arrest  BRIEF PATIENT DESCRIPTION: 73 year old male with PMH MI, CVA, A-fib, and seizures suffered witnessed cardiac arrest 6/24.  Initial rhythm VF. ROSC achieved with defibrillation, CPR and amiodarone. In ED found to have ECG changes. PCCM asked to see for admission.   SIGNIFICANT EVENTS / STUDIES:  6/24 adm after VF arrest. Hypothermia initiated 6/24 CT head: No acute intracranial process. Remote CVA 6/24 CTA chest - No pulmonary emboli. 10 mm nodule in RUL. Multiple bilateral anterior rib fractures. 6/24 LHC: severe 2 vessel obstructive CAD - not amenable to PCI. Severe LV dysfunction (LVEF 25%). Elevated LV pressures. Rec med rx. Consider CABG in future 6/25 TTE:  LVEF 30-35%, mod dil LV, akinesis of entire inf myocardium, basal-mid inferolateral myocardium, sclerosis of aortic valve, mod dil aorta and mod dil LA  LINES / TUBES: R - port-a-cath (chronic) LIJ CVL 6/24 >>> ETT 6/24 >>>  CULTURES: Urine 6/24 >>> NEG MRSA PCR 6/24 >>> NEG  ANTIBIOTICS: None  SUBJECTIVE:   6/27: rewarmed. PEr elink report: myoclonus and therefore versed started. Per Nursing: opened eyes, has gag but not following commands -? On sedation gtt. Formal WUA in progress. Also, amio started for a flutter/a tach   VITAL SIGNS: Temp:  [95.9 F (35.5 C)-99.9 F (37.7 C)] 99.9 F (37.7 C) (06/27 0400) Pulse Rate:  [58-141] 76 (06/27 0730) Resp:  [14-26] 26 (06/27 0730) BP: (102-173)/(55-76) 173/76 mmHg (06/27 0730) SpO2:  [95 %-100 %] 96 % (06/27 0700) Arterial Line BP: (92-186)/(49-105) 126/51 mmHg (06/27 0700) FiO2 (%):  [30 %] 30 % (06/27 0730) Weight:  [83.51 kg (184 lb 1.7 oz)] 83.51 kg (184 lb 1.7 oz) (06/27 0329)  HEMODYNAMICS: CVP:  [2 mmHg-10 mmHg] 2  mmHg  VENTILATOR SETTINGS: Vent Mode:  [-] PRVC FiO2 (%):  [30 %] 30 % Set Rate:  [14 bmp] 14 bmp Vt Set:  [550 mL] 550 mL PEEP:  [5 cmH20] 5 cmH20 Plateau Pressure:  [13 cmH20-15 cmH20] 13 cmH20  INTAKE / OUTPUT: Intake/Output     06/26 0701 - 06/27 0700 06/27 0701 - 06/28 0700   I.V. (mL/kg) 1920.4 (23)    NG/GT 444.5    IV Piggyback 316    Total Intake(mL/kg) 2680.9 (32.1)    Urine (mL/kg/hr) 390 (0.2)    Total Output 390     Net +2290.9           PHYSICAL EXAMINATION: General: sedated, paralyzed.  Neuro: limited exam due to NMBs  HEENT:  [place]/AT, pupils asymmetric with R cataract Cardiovascular: IRIR, no M noted Lungs:  Clear anteriorly Abdomen:  Soft, non-distended. BS + Ext. Mild peripheral edema.  LABS:  PULMONARY  Recent Labs Lab 02/18/2014 1950  03/05/14 0154 03/05/14 0444 03/05/14 0636 03/05/14 0922 03/06/14 0500  PHART 7.218*  --  7.425  --   --  7.419 7.410  PCO2ART 59.5*  --  31.2*  --   --  33.8* 31.6*  PO2ART 449.0*  --  191.0*  --   --  188.0* 113.0*  HCO3 24.2*  --  21.4  --   --  22.8 20.4  TCO2 26  < > 23 21 22 24  21.5  O2SAT 100.0  --  100.0  --   --  100.0 98.8  < > =  values in this interval not displayed.  CBC  Recent Labs Lab 03/05/14 0300  03/05/14 0636 03/06/14 0400 03/07/14 0437  HGB 14.5  < > 15.3 14.1 12.7*  HCT 41.6  < > 45.0 40.2 37.5*  WBC 5.6  --   --  6.9 6.3  PLT 49*  --   --  62* 62*  < > = values in this interval not displayed.  COAGULATION  Recent Labs Lab 02/23/2014 1949 03/05/14 0055 03/05/14 1430  INR 1.83* 1.89* 2.01*    CARDIAC   Recent Labs Lab 03/03/2014 1949  TROPONINI <0.30    Recent Labs Lab 03/05/14 0307  PROBNP 1114.0*     CHEMISTRY  Recent Labs Lab 03/05/14 0043 03/05/14 0300  03/05/14 1742 03/05/14 2200 03/06/14 0230 03/06/14 0400 03/07/14 0437  NA 140 137  < > 144 137 140 141 139  K 3.3* 3.6*  < > 3.5* 3.6* 3.9 3.9 4.2  CL 100 99  < > 108 102 106 105 106  CO2  --  22  <  > 21 20 21 20 21   GLUCOSE 198* 195*  < > 144* 176* 168* 154* 151*  BUN 14 14  < > 14 14 14 14 19   CREATININE 0.80 0.75  < > 0.71 0.71 0.72 0.74 0.98  CALCIUM  --  8.6  < > 8.3* 8.4 8.3* 8.5 8.2*  MG  --  1.7  --   --   --   --  1.6  --   PHOS  --  2.0*  --   --   --   --  3.5  --   < > = values in this interval not displayed. Estimated Creatinine Clearance: 71.5 ml/min (by C-G formula based on Cr of 0.98).   LIVER  Recent Labs Lab 02/18/2014 1949 03/05/14 0055 03/05/14 1430  AST 70*  --   --   ALT 59*  --   --   ALKPHOS 69  --   --   BILITOT 0.6  --   --   PROT 5.4*  --   --   ALBUMIN 3.4*  --   --   INR 1.83* 1.89* 2.01*     INFECTIOUS  Recent Labs Lab 03/05/14 1422 03/05/14 2000 03/06/14 0235  LATICACIDVEN 2.2 3.0* 2.7*     ENDOCRINE CBG (last 3)   Recent Labs  03/06/14 2006 03/06/14 2324 03/07/14 0308  GLUCAP 140* 137* 115*         IMAGING x48h  Dg Chest Port 1 View  03/06/2014   CLINICAL DATA:  Endotracheal tube placement  EXAM: PORTABLE CHEST - 1 VIEW  COMPARISON:  03/05/2014  FINDINGS: There is an endotracheal tube with the tip 6 cm above the carina. There is a right-sided Port-A-Cath in satisfactory position. There is a left IJ central venous catheter in unchanged satisfactory position. There is a nasogastric tube with the distal portion excluded from the field of view coursing below the diaphragm.  The lungs are clear. Stable cardiomediastinal silhouette. Unremarkable osseous structures.  IMPRESSION: Endotracheal tube with the tip 7.5 cm above the carina.   Electronically Signed   By: Elige KoHetal  Patel   On: 03/06/2014 08:00      ASSESSMENT / PLAN:  PULMONARY A: Acute respiratory failure 2nd to cardiac arrest H/o COPD Lung nodule on CT   - does not meet sbt/extubation criteria due to mental status. ET tube high  P:   Advance ET tube and recheck cxr Cont  full vent support - settings reviewed and/or adjusted Cont vent bundle Daily SBT  if/when meets criteria  CARDIOVASCULAR A:  VF arrest  Hypertension Atrial fib Critical CAD   - on amio gtt since 03/06/14. Offf pressors P:  DC aline MAP goal to 65 mmHg Cards following Consider CABG if neuro recovery good  RENAL A:   Lactic acidosis, resolving Low mag  P:   Recheck lactic acid Monitor BMET ,  Recheck mag and phos Monitor I/Os Correct electrolytes as indicated  GASTROINTESTINAL A:   No issues P:   SUP: IV PPI TFs 6/26  HEMATOLOGIC A:   Chronic warfarin therapy for CAF Thrombocytopenia of uncertain etiology P:  DVT px: SCDs Monitor CBC intermittently Transfuse per usual ICU guidelines Holding warfarin  INFECTIOUS A:  No overt infections P:   Micro and abx as above Monitor WBC, temp  ENDOCRINE A:   Hyperglycemia without prior DM P:   Cont SSI  NEUROLOGIC A:   Acute encephalopathy, post anoxic H/o seizures, CVA   - possible myoclonus 03/06/14 and on versed gtt  P:   DC versed gtt STart diprivan gtt as needed; monitor lactate and ck  PRN fentanyl only Daily WUA Nneuro eval if true myoclonus noted again   No family available to update    The patient is critically ill with multiple organ systems failure and requires high complexity decision making for assessment and support, frequent evaluation and titration of therapies, application of advanced monitoring technologies and extensive interpretation of multiple databases.   Critical Care Time devoted to patient care services described in this note is  35  Minutes.  Dr. Kalman ShanMurali Tejasvi Brissett, M.D., [facility].C.P Pulmonary and Critical Care Medicine Staff Physician [facility] [place] [facility]monary and Critical [facility] Pager: [phone], If no answer or between  15:00h - 7:00h: call [phone]  03/07/2014 8:12 AM

## 2014-03-07 NOTE — Progress Notes (Addendum)
RT note: advanced ETT per MD.  Current placement is 28cm which is at the hub of ETT.

## 2014-03-07 NOTE — Progress Notes (Signed)
Pt. Seemed to have aspirated his tube feedings when I attempted to suction his airway. Pt RR increased into the 40's  when suctioned, but returned to his normal after about 3 minutes.

## 2014-03-07 NOTE — Progress Notes (Addendum)
Another brief seizurew activity noted ( movements of heasd and shoulders .Propofol 10 mg bolus given IV. No seizure activity noted w/in 2 min.

## 2014-03-07 NOTE — Progress Notes (Addendum)
Seizure activity noted ----involving head and shoulder  Jerking rythmically . Propofol 10 mg bolus given IV . Pt  W/o notable activity w/in 3 min .

## 2014-03-08 ENCOUNTER — Inpatient Hospital Stay (HOSPITAL_COMMUNITY): Payer: Medicare Other

## 2014-03-08 LAB — CBC WITH DIFFERENTIAL/PLATELET
Basophils Absolute: 0 10*3/uL (ref 0.0–0.1)
Basophils Relative: 0 % (ref 0–1)
Eosinophils Absolute: 0 10*3/uL (ref 0.0–0.7)
Eosinophils Relative: 0 % (ref 0–5)
HCT: 33.6 % — ABNORMAL LOW (ref 39.0–52.0)
Hemoglobin: 11.3 g/dL — ABNORMAL LOW (ref 13.0–17.0)
LYMPHS ABS: 0.6 10*3/uL — AB (ref 0.7–4.0)
LYMPHS PCT: 12 % (ref 12–46)
MCH: 31.9 pg (ref 26.0–34.0)
MCHC: 33.6 g/dL (ref 30.0–36.0)
MCV: 94.9 fL (ref 78.0–100.0)
Monocytes Absolute: 0.4 10*3/uL (ref 0.1–1.0)
Monocytes Relative: 9 % (ref 3–12)
NEUTROS PCT: 80 % — AB (ref 43–77)
Neutro Abs: 3.8 10*3/uL (ref 1.7–7.7)
PLATELETS: 61 10*3/uL — AB (ref 150–400)
RBC: 3.54 MIL/uL — ABNORMAL LOW (ref 4.22–5.81)
RDW: 14.7 % (ref 11.5–15.5)
WBC: 4.8 10*3/uL (ref 4.0–10.5)

## 2014-03-08 LAB — BASIC METABOLIC PANEL
BUN: 25 mg/dL — ABNORMAL HIGH (ref 6–23)
CHLORIDE: 104 meq/L (ref 96–112)
CO2: 24 mEq/L (ref 19–32)
Calcium: 8.4 mg/dL (ref 8.4–10.5)
Creatinine, Ser: 0.85 mg/dL (ref 0.50–1.35)
GFR calc Af Amer: >90 mL/min (ref >90)
GFR calc non Af Amer: 83 mL/min — ABNORMAL LOW (ref >90)
Glucose, Bld: 148 mg/dL — ABNORMAL HIGH (ref 70–99)
POTASSIUM: 3.8 meq/L (ref 3.7–5.3)
Sodium: 138 mEq/L (ref 137–147)

## 2014-03-08 LAB — MAGNESIUM: Magnesium: 2 mg/dL (ref 1.5–2.5)

## 2014-03-08 LAB — HEPARIN LEVEL (UNFRACTIONATED): Heparin Unfractionated: 0.16 IU/mL — ABNORMAL LOW (ref 0.30–0.70)

## 2014-03-08 LAB — GLUCOSE, CAPILLARY
Glucose-Capillary: 140 mg/dL — ABNORMAL HIGH (ref 70–99)
Glucose-Capillary: 148 mg/dL — ABNORMAL HIGH (ref 70–99)
Glucose-Capillary: 158 mg/dL — ABNORMAL HIGH (ref 70–99)
Glucose-Capillary: 132 mg/dL — ABNORMAL HIGH (ref 70–99)

## 2014-03-08 LAB — PHOSPHORUS: PHOSPHORUS: 1.9 mg/dL — AB (ref 2.3–4.6)

## 2014-03-08 MED ORDER — HEPARIN BOLUS VIA INFUSION
4000.0000 [IU] | Freq: Once | INTRAVENOUS | Status: AC
  Administered 2014-03-08: 4000 [IU] via INTRAVENOUS
  Filled 2014-03-08: qty 4000

## 2014-03-08 MED ORDER — PROPOFOL BOLUS VIA INFUSION
1.0000 mg/kg | Freq: Once | INTRAVENOUS | Status: AC
  Administered 2014-03-08 (×4): 20 mg via INTRAVENOUS
  Filled 2014-03-08: qty 85

## 2014-03-08 MED ORDER — CARVEDILOL 3.125 MG PO TABS
3.1250 mg | ORAL_TABLET | Freq: Two times a day (BID) | ORAL | Status: DC
Start: 2014-03-08 — End: 2014-03-10
  Administered 2014-03-10: 3.125 mg via ORAL
  Filled 2014-03-08 (×6): qty 1

## 2014-03-08 MED ORDER — AMIODARONE HCL IN DEXTROSE 360-4.14 MG/200ML-% IV SOLN
60.0000 mg/h | INTRAVENOUS | Status: AC
  Administered 2014-03-08 (×2): 60 mg/h via INTRAVENOUS
  Filled 2014-03-08: qty 200

## 2014-03-08 MED ORDER — SODIUM CHLORIDE 0.9 % IV BOLUS (SEPSIS)
500.0000 mL | Freq: Once | INTRAVENOUS | Status: AC
  Administered 2014-03-08: 500 mL via INTRAVENOUS

## 2014-03-08 MED ORDER — PANTOPRAZOLE SODIUM 40 MG PO PACK
40.0000 mg | PACK | Freq: Every day | ORAL | Status: DC
  Administered 2014-03-08 – 2014-03-16 (×9): 40 mg
  Filled 2014-03-08 (×10): qty 20

## 2014-03-08 MED ORDER — SODIUM CHLORIDE 0.9 % IV SOLN
15.0000 mg/kg | Freq: Once | INTRAVENOUS | Status: AC
  Administered 2014-03-08: 1270 mg via INTRAVENOUS
  Filled 2014-03-08: qty 25.4

## 2014-03-08 MED ORDER — DEXTROSE 5 % IV SOLN
2.0000 ug/min | INTRAVENOUS | Status: DC
  Administered 2014-03-08: 5 ug/min via INTRAVENOUS
  Filled 2014-03-08: qty 4

## 2014-03-08 MED ORDER — HEPARIN (PORCINE) IN NACL 100-0.45 UNIT/ML-% IJ SOLN
1350.0000 [IU]/h | INTRAMUSCULAR | Status: DC
  Administered 2014-03-08: 1000 [IU]/h via INTRAVENOUS
  Administered 2014-03-09 (×2): 1350 [IU]/h via INTRAVENOUS
  Filled 2014-03-08 (×3): qty 250

## 2014-03-08 MED ORDER — POTASSIUM CHLORIDE CRYS ER 20 MEQ PO TBCR
40.0000 meq | EXTENDED_RELEASE_TABLET | Freq: Once | ORAL | Status: AC
  Administered 2014-03-08: 40 meq via ORAL
  Filled 2014-03-08: qty 2

## 2014-03-08 MED ORDER — AMIODARONE HCL IN DEXTROSE 360-4.14 MG/200ML-% IV SOLN
30.0000 mg/h | INTRAVENOUS | Status: DC
  Filled 2014-03-08 (×4): qty 200

## 2014-03-08 MED ORDER — SODIUM CHLORIDE 0.9 % IV SOLN
1000.0000 mg | Freq: Two times a day (BID) | INTRAVENOUS | Status: DC
  Administered 2014-03-08 – 2014-03-12 (×8): 1000 mg via INTRAVENOUS
  Filled 2014-03-08 (×9): qty 10

## 2014-03-08 MED ORDER — AMIODARONE LOAD VIA INFUSION
150.0000 mg | Freq: Once | INTRAVENOUS | Status: AC
  Administered 2014-03-08: 150 mg via INTRAVENOUS
  Filled 2014-03-08: qty 83.34

## 2014-03-08 MED ORDER — POTASSIUM PHOSPHATES 15 MMOLE/5ML IV SOLN
24.0000 mmol | Freq: Once | INTRAVENOUS | Status: AC
  Administered 2014-03-08: 24 mmol via INTRAVENOUS
  Filled 2014-03-08: qty 8

## 2014-03-08 MED ORDER — NOREPINEPHRINE BITARTRATE 1 MG/ML IV SOLN
2.0000 ug/min | INTRAVENOUS | Status: DC
  Filled 2014-03-08: qty 4

## 2014-03-08 MED ORDER — PROPOFOL 10 MG/ML IV EMUL
0.0000 ug/kg/min | INTRAVENOUS | Status: DC
  Administered 2014-03-08: 3.992 ug/kg/min via INTRAVENOUS
  Administered 2014-03-08: 50 ug/kg/min via INTRAVENOUS
  Administered 2014-03-08: 80 ug/kg/min via INTRAVENOUS
  Administered 2014-03-08: 3.992 ug/kg/min via INTRAVENOUS
  Administered 2014-03-08: 0 ug/kg/min via INTRAVENOUS
  Administered 2014-03-09: 90 ug/kg/min via INTRAVENOUS
  Administered 2014-03-09 (×3): 70 ug/kg/min via INTRAVENOUS
  Administered 2014-03-09: 25 ug/kg/min via INTRAVENOUS
  Administered 2014-03-09 (×2): 90 ug/kg/min via INTRAVENOUS
  Administered 2014-03-09: 80 ug/kg/min via INTRAVENOUS
  Administered 2014-03-10: 10 ug/kg/min via INTRAVENOUS
  Filled 2014-03-08 (×11): qty 100

## 2014-03-08 MED ORDER — NOREPINEPHRINE BITARTRATE 1 MG/ML IV SOLN
2.0000 ug/min | INTRAVENOUS | Status: DC
  Administered 2014-03-08: 15 ug/min via INTRAVENOUS
  Administered 2014-03-09: 18 ug/min via INTRAVENOUS
  Administered 2014-03-09: 20 ug/min via INTRAVENOUS
  Administered 2014-03-10: 2 ug/min via INTRAVENOUS
  Administered 2014-03-11: 5 ug/min via INTRAVENOUS
  Filled 2014-03-08 (×4): qty 16

## 2014-03-08 MED ORDER — PHENYTOIN SODIUM 50 MG/ML IJ SOLN
100.0000 mg | Freq: Three times a day (TID) | INTRAMUSCULAR | Status: DC
  Administered 2014-03-08 – 2014-03-17 (×26): 100 mg via INTRAVENOUS
  Filled 2014-03-08 (×30): qty 2

## 2014-03-08 MED ORDER — PROPOFOL BOLUS VIA INFUSION: 1.0000 mg/kg | Freq: Once | INTRAVENOUS | Status: DC

## 2014-03-08 NOTE — Progress Notes (Signed)
Levophed gtt begun at 95mcg/min . No more seizure activity "apparent" since loading dose of dilantin given IV . EEG monitoring continues .

## 2014-03-08 NOTE — Progress Notes (Signed)
PULMONARY / CRITICAL CARE MEDICINE   Name: Kyle [name] MRN: [id number] DOB: 11/18/1940    ADMISSION DATE:  03/08/2014 CONSULTATION DATE:  02/21/2014  REFERRING MD :  EDP PRIMARY SERVICE: PCCM  CHIEF COMPLAINT:  Cardiac arrest  BRIEF PATIENT DESCRIPTION: 73 year old male with PMH MI, CVA, A-fib, and seizures suffered witnessed cardiac arrest 6/24.  Initial rhythm VF. ROSC achieved with defibrillation, CPR and amiodarone. In ED found to have ECG changes. PCCM asked to see for admission.   SIGNIFICANT EVENTS / STUDIES:  6/24 adm after VF arrest. Hypothermia initiated 6/24 CT head: No acute intracranial process. Remote CVA 6/24 CTA chest - No pulmonary emboli. 10 mm nodule in RUL. Multiple bilateral anterior rib fractures. 6/24 LHC: severe 2 vessel obstructive CAD - not amenable to PCI. Severe LV dysfunction (LVEF 25%). Elevated LV pressures. Rec med rx. Consider CABG in future 6/25 TTE:  LVEF 30-35%, mod dil LV, akinesis of entire inf myocardium, basal-mid inferolateral myocardium, sclerosis of aortic valve, mod dil aorta and mod dil LA  6/27: rewarmed. PEr elink report: myoclonus and therefore versed started. Per Nursing: opened eyes, has gag but not following commands -? On sedation gtt. Formal WUA in progress. Also, amio started for a flutter/a tach   LINES / TUBES: R - port-a-cath (chronic) LIJ CVL 6/24 >>> ETT 6/24 >>>  CULTURES: Urine 6/24 >>> NEG MRSA PCR 6/24 >>> NEG  ANTIBIOTICS: None  SUBJECTIVE:    03/07/14: Intermittent seizures controlled by diprivan. Not responsive on wua. EEG ordered.  CT head - no anoxia, remote posterior LMCA stroke. Off amio gtt; still in A Fib  VITAL SIGNS: Temp:  [97.6 F (36.4 C)-100.8 F (38.2 C)] 99.2 F (37.3 C) (06/28 0725) Pulse Rate:  [41-134] 73 (06/28 0715) Resp:  [17-30] 21 (06/28 0715) BP: (100-176)/(49-75) 126/60 mmHg (06/28 0715) SpO2:  [93 %-99 %] 98 % (06/28 0752) Arterial Line BP: (131-171)/(52-69) 170/58 mmHg (06/27  1700) FiO2 (%):  [30 %] 30 % (06/28 0804) Weight:  [84.8 kg (186 lb 15.2 oz)] 84.8 kg (186 lb 15.2 oz) (06/28 0405)  HEMODYNAMICS: CVP:  [10 mmHg] 10 mmHg  VENTILATOR SETTINGS: Vent Mode:  [-] PRVC FiO2 (%):  [30 %] 30 % Set Rate:  [14 bmp] 14 bmp Vt Set:  [550 mL] 550 mL PEEP:  [5 cmH20] 5 cmH20 Plateau Pressure:  [13 cmH20-15 cmH20] 14 cmH20  INTAKE / OUTPUT: Intake/Output     06/27 0701 - 06/28 0700 06/28 0701 - 06/29 0700   I.V. (mL/kg) 1662.3 (19.6) 5 (0.1)   NG/GT 1370    IV Piggyback 210    Total Intake(mL/kg) 3242.3 (38.2) 5 (0.1)   Urine (mL/kg/hr) 815 (0.4) 110 (0.7)   Total Output 815 110   Net +2427.3 -105         PHYSICAL EXAMINATION: General: Looks critically ill Neuro: RASS -4 on diprivan.  Off diprivan x 15 min: seemed to nod to his son that he could hear him,. Corneal +, Pupil Light reflex +. GTC Seizure + HEENT:  [place]/AT, pupils asymmetric with R cataract Cardiovascular: IRIR, no M noted Lungs:  Clear anteriorly Abdomen:  Soft, non-distended. BS + Ext. Mild peripheral edema.  LABS:  PULMONARY  Recent Labs Lab 02/27/2014 1950  03/05/14 0154 03/05/14 0444 03/05/14 0636 03/05/14 0922 03/06/14 0500  PHART 7.218*  --  7.425  --   --  7.419 7.410  PCO2ART 59.5*  --  31.2*  --   --  33.8* 31.6*  PO2ART 449.0*  --  191.0*  --   --  188.0* 113.0*  HCO3 24.2*  --  21.4  --   --  22.8 20.4  TCO2 26  < > 23 21 22 24  21.5  O2SAT 100.0  --  100.0  --   --  100.0 98.8  < > = values in this interval not displayed.  CBC  Recent Labs Lab 03/06/14 0400 03/07/14 0437 03/08/14 0430  HGB 14.1 12.7* 11.3*  HCT 40.2 37.5* 33.6*  WBC 6.9 6.3 4.8  PLT 62* 62* 61*    COAGULATION  Recent Labs Lab 02/14/2014 1949 03/05/14 0055 03/05/14 1430  INR 1.83* 1.89* 2.01*    CARDIAC    Recent Labs Lab 02/16/2014 1949  TROPONINI <0.30    Recent Labs Lab 03/05/14 0307  PROBNP 1114.0*     CHEMISTRY  Recent Labs Lab 03/05/14 0043 03/05/14 0300   03/05/14 2200 03/06/14 0230 03/06/14 0400 03/07/14 0437 03/07/14 0924 03/08/14 0430  NA 140 137  < > 137 140 141 139  --  138  K 3.3* 3.6*  < > 3.6* 3.9 3.9 4.2  --  3.8  CL 100 99  < > 102 106 105 106  --  104  CO2  --  22  < > 20 21 20 21   --  24  GLUCOSE 198* 195*  < > 176* 168* 154* 151*  --  148*  BUN 14 14  < > 14 14 14 19   --  25*  CREATININE 0.80 0.75  < > 0.71 0.72 0.74 0.98  --  0.85  CALCIUM  --  8.6  < > 8.4 8.3* 8.5 8.2*  --  8.4  MG  --  1.7  --   --   --  1.6  --  2.1 2.0  PHOS  --  2.0*  --   --   --  3.5  --  1.8* 1.9*  < > = values in this interval not displayed. Estimated Creatinine Clearance: 82.4 ml/min (by C-G formula based on Cr of 0.85).   LIVER  Recent Labs Lab 02/25/2014 1949 03/05/14 0055 03/05/14 1430  AST 70*  --   --   ALT 59*  --   --   ALKPHOS 69  --   --   BILITOT 0.6  --   --   PROT 5.4*  --   --   ALBUMIN 3.4*  --   --   INR 1.83* 1.89* 2.01*     INFECTIOUS  Recent Labs Lab 03/05/14 2000 03/06/14 0235 03/07/14 0807  LATICACIDVEN 3.0* 2.7* 1.1     ENDOCRINE CBG (last 3)   Recent Labs  03/07/14 2338 03/08/14 0348 03/08/14 0727  GLUCAP 117* 140* 148*         IMAGING x48h  Ct Head Wo Contrast  03/07/2014   CLINICAL DATA:  Evaluate for anoxic brain injury, status post cardiac arrest. Atrial fibrillation. Seizures. CVA. Hypertension.  EXAM: CT HEAD WITHOUT CONTRAST  TECHNIQUE: Contiguous axial images were obtained from the base of the skull through the vertex without intravenous contrast.  COMPARISON:  02/24/2014  FINDINGS: Sinuses/Soft tissues: Endotracheal tube. Mild motion degradation. Mucosal thickening of the left maxillary sinus with minimal fluid within. Hypoplastic frontal sinuses.  Intracranial: Mild cerebral atrophy. Posterior left MCA distribution remote cortically based infarct. Mild low density in the periventricular white matter likely related to small vessel disease. Gray-white differentiation maintained.  Basal cisterns patent.  No mass lesion, hemorrhage, hydrocephalus, acute infarct, intra-axial,  or extra-axial fluid collection.  IMPRESSION: 1. No evidence of anoxic brain injury. 2. Mildly motion degraded exam. 3. Remote posterior left MCA distribution cortically based infarct. 4. Sinus disease.   Electronically Signed   By: Jeronimo Greaves M.D.   On: 03/07/2014 13:15   Dg Chest Port 1 View  03/08/2014   CLINICAL DATA:  Evaluate endotracheal tube.  EXAM: PORTABLE CHEST - 1 VIEW  COMPARISON:  1 day prior  FINDINGS: Endotracheal tube terminates 6.5 cm above carina. Nasogastric extends beyond the inferior aspect of the film. Right sided Port-A-Cath terminates at low SVC.  The right costophrenic angle minimally is excluded. Left internal jugular line unchanged. Midline trachea. moderate cardiomegaly. No pleural effusion or pneumothorax. Similar patchy bibasilar airspace disease.  IMPRESSION: No significant change in patchy bibasilar Airspace disease, likely atelectasis.   Electronically Signed   By: Jeronimo Greaves M.D.   On: 03/08/2014 07:30   Dg Chest Port 1 View  03/07/2014   CLINICAL DATA:  Endotracheal tube position.  EXAM: PORTABLE CHEST - 1 VIEW  COMPARISON:  03/07/2014 at 1009 hr  FINDINGS: Endotracheal tube terminates at the level of the clavicular heads, well above the carina, much more proximally than on the prior study. Right jugular Port-A-Cath and left jugular central venous catheter are unchanged. Enteric tube courses into the left upper abdomen.  Cardiac silhouette remains enlarged, unchanged. Retrocardiac left lower lobe opacity does not appear significantly changed. No pleural effusion or pneumothorax is identified.  IMPRESSION: 1. Interval retraction of endotracheal tube as above. 2. Unchanged left lower lobe atelectasis versus infiltrate.   Electronically Signed   By: Sebastian Ache   On: 03/07/2014 19:58   Dg Chest Port 1 View  03/07/2014   CLINICAL DATA:  check ETT  EXAM: PORTABLE CHEST - 1 VIEW   COMPARISON:  03/07/2014  FINDINGS: Endotracheal tube, nasogastric tube, right IJ port catheter remain in place. Slight improvement in the retrocardiac consolidation/atelectasis. Right lung clear. No effusion. Visualized skeletal structures are unremarkable.  IMPRESSION: 1. Little change since previous exam.   Electronically Signed   By: Oley Balm M.D.   On: 03/07/2014 11:35   Dg Chest Port 1 View  03/07/2014   CLINICAL DATA:  Respiratory failure.  EXAM: PORTABLE CHEST - 1 VIEW  COMPARISON:  Chest x-ray 03/06/2014.  FINDINGS: An endotracheal tube is in place with tip 4.7 cm above the carina. There is a left-sided internal jugular central venous catheter with tip terminating in the distal superior vena cava. Right internal jugular single-lumen porta cath with tip terminating at the superior cavoatrial junction. A nasogastric tube is seen extending into the stomach, however, the tip of the nasogastric tube extends below the lower margin of the image. Lung volumes are low. Opacity in the medial aspect of the left lung base may reflect atelectasis and/or consolidation. Lungs are otherwise clear. No pleural effusions. No evidence of pulmonary edema. Heart size is normal. Upper mediastinal contours are within normal limits allowing for patient rotation to the left.  IMPRESSION: 1. Support apparatus, as above. 2. Opacity in the medial aspect of the left lung base may reflect atelectasis and/or consolidation (this region appeared atelectatic on recent chest CT 02/22/2014).   Electronically Signed   By: Trudie Reed M.D.   On: 03/07/2014 10:16      ASSESSMENT / PLAN:  PULMONARY A: Acute respiratory failure 2nd to cardiac arrest H/o COPD Lung nodule on CT   - does not meet sbt/extubation criteria due to mental status.  ET tube still  high  P:   Advance ET tube  Cont full vent support - settings reviewed and/or adjusted Cont vent bundle Daily SBT if/when meets criteria  CARDIOVASCULAR A:  VF  arrest  Hypertension Atrial fib Critical CAD   - off amio gtt since 03/07/14. Offf pressors x 48h P:  MAP goal to 65 mmHg Cards following Consider CABG if neuro recovery good  RENAL A:   Lactic acidosis, resolved Low phos  P:   Replete low phos Monitor BMET ,   Monitor I/Os Correct electrolytes as indicated  GASTROINTESTINAL A:   No issues P:   SUP: IV PPI TFs 6/26  HEMATOLOGIC A:   Chronic warfarin therapy for CAF Thrombocytopenia of uncertain etiology P:  DVT px: SCDs Monitor CBC intermittently Transfuse per usual ICU guidelines Holding warfarin  INFECTIOUS A:  No overt infections P:   Micro and abx as above Monitor WBC, temp  ENDOCRINE A:    Hyperglycemia without prior DM P:   Cont SSI  NEUROLOGIC A:   Acute encephalopathy, post anoxic H/o seizures, CVA   - having seizures - controlled with diprivan. Unresponsive. CT head no anoxia but has old L MCA stroke. ? Seizures due to anoxia v old stroke; suspect due to old stroke  P:   Diprivan gtt as needed; monitor lactate and ck prn PRN fentanyl only Daily WUA EEG pending Neuro consult - called Dr Rod CanStewart   Son updated at bedside. This might not be anoxic seizures but will await neuro prognosis    The patient is critically ill with multiple organ systems failure and requires high complexity decision making for assessment and support, frequent evaluation and titration of therapies, application of advanced monitoring technologies and extensive interpretation of multiple databases.   Critical Care Time devoted to patient care services described in this note is  35  Minutes.  Dr. Kalman ShanMurali Ramaswamy, M.D., [facility].P Pulmonary and Critical Care Medicine Staff Physician [place] [facility] [place] Pulmonary and Critical Care Pager: [phone], If no answer or between  15:00h - 7:00h: call [phone]  03/08/2014 8:57 AM

## 2014-03-08 NOTE — Progress Notes (Addendum)
Pt in sustained V-tach . HR 180 .initially soft radial pulese felt  . 1 synchronized shock delivered . Pt immediately converted to A-fib w/HR sustained in 6180 's. Radial pulses bounding . B/p stable.

## 2014-03-08 NOTE — Progress Notes (Signed)
Dr Marchelle Gearingamaswamy at bedisde, noted seizure activity :  blinking of eyes rhythmically and lifting bilat arms approx 12 inches above bed while elbows were kept erect.Pt made no acknowledgement of hearing commands .Propofol 10 mg given IV  Bolus.Propofol was stopped at 08:48am per Dr's order so that another WUA could be done .

## 2014-03-08 NOTE — Progress Notes (Addendum)
SUBJECTIVE:  73 y/o male with previous CVA admitted witnesses OOH arrest requiring CPR and cooling. 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 His anatomy is not favorable for PCI. He will need consideration for CABG if he recovers neurological function. LVEF is 25% by LV gram. Echo with LVEF=30-35%.   Remains Intubated. Sedated. Nonresponsive. Continues with myoclonus concerning for seizure activity.   Repeat brain CT 6/27: IMPRESSION:  1. No evidence of anoxic brain injury.  2. Mildly motion degraded exam.  3. Remote posterior left MCA distribution cortically based infarct.  4. Sinus disease.    CCM called neuro today and EEG pending. Was on amio for AF which was stopped today.   During rounds had recurrent VT arrest. Code Blue called and I was first MD responder. Shocked with 150J and quickly returned to his baseline AF.   Dr. Marchelle Gearingamaswamy talked to family early this am and waiting to get results of EEG prior to making further decisions.   Tele: Atrial fib, rate 70 bpm  BP 157/79  Pulse 97  Temp(Src) 99.2 F (37.3 C) (Oral)  Resp 17  Ht 6' (1.829 m)  Wt 84.8 kg (186 lb 15.2 oz)  BMI 25.35 kg/m2  SpO2 98%  Intake/Output Summary (Last 24 hours) at 03/08/14 1147 Last data filed at 03/08/14 1135  Gross per 24 hour  Intake 2780.96 ml  Output   1055 ml  Net 1725.96 ml    PHYSICAL EXAM General: Intubated. Does not respond to voice or pain. Involuntary eyelid twitching Neck: No JVD. No masses noted.  Lungs: Mechanical breath sounds bilaterally.  Heart: Irregular with no murmurs noted.  Abdomen: Bowel sounds are present. Soft, non-tender.  Extremities: No lower extremity edema.   LABS: Basic Metabolic Panel:  Recent Labs  16/06/9605/27/15 0437 03/07/14 0924 03/08/14 0430  NA 139  --  138  K 4.2  --  3.8  CL 106  --  104  CO2 21  --  24  GLUCOSE 151*  --  148*  BUN 19  --  25*  CREATININE 0.98  --  0.85  CALCIUM 8.2*  --  8.4  MG  --  2.1 2.0  PHOS  --  1.8*  1.9*   CBC:  Recent Labs  03/07/14 0437 03/08/14 0430  WBC 6.3 4.8  NEUTROABS  --  3.8  HGB 12.7* 11.3*  HCT 37.5* 33.6*  MCV 94.7 94.9  PLT 62* 61*   Cardiac Enzymes: No results found for this basename: CKTOTAL, CKMB, CKMBINDEX, TROPONINI,  in the last 72 hours Current Meds: . antiseptic oral rinse  15 mL Mouth Rinse QID  . aspirin  81 mg Per Tube Daily  . atorvastatin  80 mg Per NG tube q1800  . chlorhexidine  15 mL Mouth Rinse BID  . heparin  5,000 Units Subcutaneous 3 times per day  . insulin aspart  0-15 Units Subcutaneous 6 times per day  . ipratropium-albuterol  3 mL Nebulization Q6H  . levETIRAcetam  500 mg Intravenous Q12H  . lisinopril  5 mg Oral Daily  . pantoprazole sodium  40 mg Per Tube Daily  . potassium phosphate IVPB (mmol)  24 mmol Intravenous Once   Cardiac cath 03/05/14:  Left mainstem: There is diffuse calcification with 40% ostial left main disease. The left main tapers distally to 30%.  Left anterior descending (LAD): The LAD is a large vessel that gives rise to a moderately large diagonal branch. In the proximal LAD there is  an aneurysmal segment followed immediately by a severe bifurcation stenosis 1:1:1 in the LAD and first diagonal up to 95%.  There is a moderately large ramus branch without significant disease.  Left circumflex (LCx): The LCx has mild ostial disease but otherwise has no significant stenosis.  Right coronary artery (RCA): Dominant vessel with 70% proximal stenosis. The crux of the RCA is severely diseased up to 90%. The Distal RCA is occluded with right to right and left to right collaterals.  Left ventriculography: Left ventricular systolic function is abnormal, LV size is increased with extensive inferior akinesis and global hypokinesis. LVEF is estimated at 25%, there is mild to moderate mitral regurgitation   Echo 03/05/14: Left ventricle: The cavity size was moderately dilated. Systolic function was moderately to severely reduced.  The estimated ejection fraction was in the range of 30% to 35%. There is akinesis of the entireinferior myocardium. There is akinesis of the basal-midinferolateral myocardium. - Aortic valve: Moderate thickening and calcification, consistent with sclerosis. There was trivial regurgitation. - Ascending aorta: The ascending aorta was mildly dilated. - Left atrium: The atrium was mildly dilated.  ASSESSMENT AND PLAN:   1. Ventricular fibrillation arrest, out of hospital: Witnessed arrest at home. Likely primary arrythmia event with ischemic cardiomyopathy. Bystander CPR. Admitted by CCM. Pt found to have high grade CAD with complex disease involving the LAD/Diagonal bifurcation and severe disease in the RCA but these lesions were not acute. No PCI performed. S/p Cooling protocol per PCCM. Recurrent VT this am. Shocked. Will restart amio. K 3.9 mg 2.0. Will supp K+  2. CAD: Severe double vessel disease by cath 03/05/14. His anatomy is not favorable for PCI. He will need consideration for CABG if he recovers neurological function.On ASA. b-blocker stopped due to severe bradycardia earlier. LVEF is 25% by LV gram. Echo with LVEF=30-35%.   3. Ischemic cardiomyopathy: As above. Will follow for now. No signs of ADHF. Continue ACe/b-blocker.   4. Atrial fib/AFL: rate controlled. On amio for VT. Continue heparin  5. Possible anoxic brain injury: No evidence on grey-white effacement on CT. ? If this is just seizure activity, Neurology has been consulted. EEG pending.   6. H/o CVA  I directed Code Blue this am personally at bedside and performed DC-CV with RN Aundria Rudogers..   The patient is critically ill with multiple organ systems failure and requires high complexity decision making for assessment and support, frequent evaluation and titration of therapies, application of advanced monitoring technologies and extensive interpretation of multiple databases.   Critical Care Time devoted to patient care  services described in this note is 35 Minutes.    Arvilla Meresaniel Bensimhon MD  6/28/201511:47 AM

## 2014-03-08 NOTE — Progress Notes (Signed)
Chaplain responded to code blue. No family present.

## 2014-03-08 NOTE — Progress Notes (Signed)
Per MD order- advance ETT 3 cm. RT spoke with MD about length of current ETT and the inability to advance 3cm. RT suggested tube exchange- RT waiting on MD guidance. RN aware.

## 2014-03-08 NOTE — Progress Notes (Signed)
ANTICOAGULATION CONSULT NOTE - Follow Up  Pharmacy Consult for heparin Indication: atrial fibrillation  No Known Allergies  Patient Measurements: Height: 6' (182.9 cm) Weight: 186 lb 15.2 oz (84.8 kg) IBW/kg (Calculated) : 77.6 Heparin Dosing Weight: 84kg  Vital Signs: BP: 98/53 mmHg (06/28 2000) Pulse Rate: 60 (06/28 2000)  Labs:  Recent Labs  03/06/14 0400 03/07/14 0437 03/08/14 0430 03/08/14 1230 03/08/14 2030  HGB 14.1 12.7* 11.3*  --   --   HCT 40.2 37.5* 33.6*  --   --   PLT 62* 62* 61*  --   --   HEPARINUNFRC  --   --   --  <0.10* 0.16*  CREATININE 0.74 0.98 0.85  --   --     Estimated Creatinine Clearance: 82.4 ml/min (by C-G formula based on Cr of 0.85).   Medical History: Past Medical History  Diagnosis Date  . A-fib   . Seizures   . Stroke 2006  . Hypertension   . Hypercholesteremia   . Blind right eye   . MI (myocardial infarction)     Assessment: 73 year old male with hx of afib on warfarin pta which has been on hold while hospitalized. Patient has been receiving sq heparin, last dose this morning.  Code blue called on patient earlier this morning, pt in sustained vt, was shocked and returned to afib. [facility] given his thrombocytopenia with pltc low but stable this morning at 61. Given recent VT and continued afib on amio will start IV heparin. INR was elevated at 1.8-2 on admission, orders to recheck in am.  Heparin level low tonight on IV heparin infusion of 1000 units/hr.  Spoke with his RN who indicates no noted bleeding, nor IV line issues.  Goal of Therapy:  Heparin level 0.3-0.7 units/ml Monitor platelets by anticoagulation protocol: Yes   Plan:  1.  Will increase IV heparin rate to 1200 units/hr 2.  Recheck Heparin level with AM labs. 3.  Monitor for bleeding complications   Nadara MustardNita Johnston, PharmD., MS Clinical Pharmacist Pager:  [phone] Thank you for allowing pharmacy to be part of this patients care  team. 03/08/2014 9:31 PM

## 2014-03-08 NOTE — Progress Notes (Signed)
Seizure activity noted  : Pt thrashing bilat arms (ea UE mimiced movements of the other) and thrust head backward and forward in rapid movements . Propofol 10 mg bolus given IV . W/in approx 3 min , pt was once again motionless in bed.

## 2014-03-08 NOTE — Consult Note (Addendum)
Reason for Consult: Hypoxic encephalopathy Referring Physician: Dr Marchelle Gearing  CC: Intubated  HPI: Kyle [name] is a 73 y.o. male with a history of coronary artery disease with previous MI, atrial fibrillation on chronic Coumadin therapy, previous stroke, seizure disorder, tobacco use, hypertension, and hyperlipidemia who was admitted to [facility] 02/20/2014 after the patient suffered an out of hospital witnessed cardiac arrest. He was noted to be in ventricular fibrillation and was revived following defibrillation and CPR. He was intubated, sedated, and taken to the Cardiac Cath Lab where he was found to have severe 2 vessel coronary artery disease not amenable to PCI. CABG may be an option if the patient stabilizes. His ejection fraction was estimated at approximately 25%.   The patient is felt to have an acute anoxic encephalopathy and neurology has been asked to assist with his evaluation. The nurses noted seizure activity on 03/07/2014 consisting of rhythmic jerking of the head and shoulders. This was treated with propofol. The nurse reports that the patient begins to seize whenever the propofol is turned off despite being on IV Keppra 500 mg every 12 hours. His home dose of Keppra was also 500 mg every 12 hours. His last seizure was around 11 AM today. A Ct of the Head Wo Contrast 03/07/2014 showed no evidence of anoxic brain injury. An EEG is pending. The patient had recurrent V. fib arrest today requiring defibrillation. The overall prognosis is felt to be poor.    Past Medical History  Diagnosis Date  . A-fib   . Seizures   . Stroke 2006  . Hypertension   . Hypercholesteremia   . Blind right eye   . MI (myocardial infarction)     Past Surgical History  Procedure Laterality Date  . Hernia repair      Family History  Problem Relation Age of Onset  . Heart disease Mother     Died, 79  . Parkinson's disease Mother   . Heart disease Father     Died, 60  . Healthy Son     Social  History:  reports that he has been smoking Cigars.  He does not have any smokeless tobacco history on file. He reports that he does not drink alcohol or use illicit drugs.  No Known Allergies  Medications:  Scheduled: . antiseptic oral rinse  15 mL Mouth Rinse QID  . aspirin  81 mg Per Tube Daily  . atorvastatin  80 mg Per NG tube q1800  . carvedilol  3.125 mg Oral BID WC  . chlorhexidine  15 mL Mouth Rinse BID  . insulin aspart  0-15 Units Subcutaneous 6 times per day  . ipratropium-albuterol  3 mL Nebulization Q6H  . levETIRAcetam  500 mg Intravenous Q12H  . lisinopril  5 mg Oral Daily  . pantoprazole sodium  40 mg Per Tube Daily  . potassium phosphate IVPB (mmol)  24 mmol Intravenous Once    ROS: Unobtainable  Physical Examination: Blood pressure 157/79, pulse 97, temperature 99.2 F (37.3 C), temperature source Oral, resp. rate 17, height 6' (1.829 m), weight 186 lb 15.2 oz (84.8 kg), SpO2 98.00%.  Neurologic Examination NEUROLOGIC:   MENTAL STATUS: The patient is sedated and on a ventilator. Eyes are closed. He follows no commands. CRANIAL NERVES: The patient is blind in his right eye and there is a cataract. The left pupil is approximately 3 mm and sluggish. There is a constant rhythmic movement of his eyelids which may represent ongoing seizure activity. He tends to move  his eyes horizontally when the lids are opened.  Corneal reflex is difficult to interpret secondary to these movements. Nursing reports a positive gag reflex with suctioning. MOTOR: There is no movement in the extremities. There is no response to painful stimuli. SENSORY: No response to light touch or painful stimuli. COORDINATION: Unable to evaluate. REFLEXES: deep tendon reflexes present and symmetric - somewhat brisk. - Toes are upgoing to plantar stimulation.   Laboratory Studies:   Basic Metabolic Panel:  Recent Labs Lab 03/05/14 0043 03/05/14 0300  03/05/14 2200 03/06/14 0230 03/06/14 0400  03/07/14 0437 03/07/14 0924 03/08/14 0430  NA 140 137  < > 137 140 141 139  --  138  K 3.3* 3.6*  < > 3.6* 3.9 3.9 4.2  --  3.8  CL 100 99  < > 102 106 105 106  --  104  CO2  --  22  < > 20 21 20 21   --  24  GLUCOSE 198* 195*  < > 176* 168* 154* 151*  --  148*  BUN 14 14  < > 14 14 14 19   --  25*  CREATININE 0.80 0.75  < > 0.71 0.72 0.74 0.98  --  0.85  CALCIUM  --  8.6  < > 8.4 8.3* 8.5 8.2*  --  8.4  MG  --  1.7  --   --   --  1.6  --  2.1 2.0  PHOS  --  2.0*  --   --   --  3.5  --  1.8* 1.9*  < > = values in this interval not displayed.  Liver Function Tests:  Recent Labs Lab 03/01/2014 1949  AST 70*  ALT 59*  ALKPHOS 69  BILITOT 0.6  PROT 5.4*  ALBUMIN 3.4*   No results found for this basename: LIPASE, AMYLASE,  in the last 168 hours No results found for this basename: AMMONIA,  in the last 168 hours  CBC:  Recent Labs Lab 02/26/2014 1949  03/05/14 0300 03/05/14 0444 03/05/14 0636 03/06/14 0400 03/07/14 0437 03/08/14 0430  WBC 5.5  --  5.6  --   --  6.9 6.3 4.8  NEUTROABS  --   --   --   --   --   --   --  3.8  HGB 13.9  < > 14.5 13.9 15.3 14.1 12.7* 11.3*  HCT 38.7*  < > 41.6 41.0 45.0 40.2 37.5* 33.6*  MCV 93.9  --  93.1  --   --  91.2 94.7 94.9  PLT 59*  --  49*  --   --  62* 62* 61*  < > = values in this interval not displayed.  Cardiac Enzymes:  Recent Labs Lab 02/23/2014 1949  TROPONINI <0.30    BNP: No components found with this basename: POCBNP,   CBG:  Recent Labs Lab 03/07/14 1644 03/07/14 1943 03/07/14 2338 03/08/14 0348 03/08/14 0727  GLUCAP 102* 149* 117* 140* 148*    Microbiology: Results for orders placed during the hospital encounter of 03/03/2014  URINE CULTURE     Status: None   Collection Time    03/06/2014  7:55 PM      Result Value Ref Range Status   Specimen Description URINE, CATHETERIZED   Final   Special Requests NONE   Final   Culture  Setup Time     Final   Value: 03/05/2014 01:10     Performed at [facility]   [place]  Count     Final   Value: NO GROWTH     Performed at [facility]   Culture     Final   Value: NO GROWTH     Performed at [facility]   Report Status 03/05/2014 FINAL   Final  MRSA PCR SCREENING     Status: None   Collection Time    03/05/14 12:31 AM      Result Value Ref Range Status   MRSA by PCR NEGATIVE  NEGATIVE Final   Comment:            The GeneXpert MRSA Assay (FDA     approved for NASAL specimens     only), is one component of a     comprehensive MRSA colonization     surveillance program. It is not     intended to diagnose MRSA     infection nor to guide or     monitor treatment for     MRSA infections.    Coagulation Studies:  Recent Labs  03/05/14 1430  LABPROT 22.8*  INR 2.01*    Urinalysis:  Recent Labs Lab 02/20/2014 1956  COLORURINE AMBER*  LABSPEC 1.024  PHURINE 5.0  GLUCOSEU 250*  HGBUR LARGE*  BILIRUBINUR NEGATIVE  KETONESUR NEGATIVE  PROTEINUR >300*  UROBILINOGEN 1.0  NITRITE NEGATIVE  LEUKOCYTESUR NEGATIVE    Lipid Panel:     Component Value Date/Time   TRIG 69 03/07/2014 1000    HgbA1C:  No results found for this basename: HGBA1C    Urine Drug Screen:     Component Value Date/Time   LABOPIA NONE DETECTED 08/26/2013 2319   COCAINSCRNUR NONE DETECTED 08/26/2013 2319   LABBENZ NONE DETECTED 08/26/2013 2319   AMPHETMU NONE DETECTED 08/26/2013 2319   THCU NONE DETECTED 08/26/2013 2319   LABBARB NONE DETECTED 08/26/2013 2319    Alcohol Level: No results found for this basename: ETH,  in the last 168 hours  Other results: EKG: - Wide complex tachycardia rate 140 beats per minute. Please see formal reading for complete details.  Imaging:  Ct Head Wo Contrast 03/07/2014    1. No evidence of anoxic brain injury.  2. Mildly motion degraded exam.  3. Remote posterior left MCA distribution cortically based infarct.  4. Sinus disease.     Dg Chest Port 1 View 03/08/2014    No significant change  in patchy bibasilar Airspace disease, likely atelectasis.      Dg Chest Port 1 View 03/07/2014    1. Interval retraction of endotracheal tube as above.  2. Unchanged left lower lobe atelectasis versus infiltrate.     EEG - pending    Assessment/Plan: 73 year old male who suffered an out of hospital witnessed V. fib arrest with subsequent resuscitation. Now in the coronary care unit on a ventilator. He has a previous seizure history and has had witnessed seizure activity in the hospital whenever his sedation is decreased despite being on IV Keppra 500 mg every 12 hours. EEG showed generalized seizure activity during a period when propofol was discontinued. Clinical findings at this point are consistent with diffuse severe hypoxic encephalopathy. Prognosis is guarded at best but likely poor for independent functional recovery.   Recommendations: 1. Increase Keppra to 1000 mg every 12 hours 2. Will add Dilantin 15 mg per kilogram loading dose followed by 100 milligrams IV every 8 hours 3. Continuous EEG monitoring overnight.  We will continue to follow this patient closely with you.  Onalee Hua Rinehuls PA-C  [facility] Pager [phone] 03/08/2014, 2:18 PM  I personally participated in this patient's evaluation and management, including formulating the above clinical impression and management recommendations.  Venetia MaxonR Stewart M.D. [facility] [phone]

## 2014-03-08 NOTE — Progress Notes (Signed)
ANTICOAGULATION CONSULT NOTE - Initial Consult  Pharmacy Consult for heparin Indication: atrial fibrillation  No Known Allergies  Patient Measurements: Height: 6' (182.9 cm) Weight: 186 lb 15.2 oz (84.8 kg) IBW/kg (Calculated) : 77.6 Heparin Dosing Weight: 84kg  Vital Signs: Temp: 99.2 F (37.3 C) (06/28 0725) Temp src: Oral (06/28 0725) BP: 157/79 mmHg (06/28 1120) Pulse Rate: 97 (06/28 1120)  Labs:  Recent Labs  03/05/14 1430  03/06/14 0400 03/07/14 0437 03/08/14 0430  HGB  --   < > 14.1 12.7* 11.3*  HCT  --   --  40.2 37.5* 33.6*  PLT  --   --  62* 62* 61*  LABPROT 22.8*  --   --   --   --   INR 2.01*  --   --   --   --   CREATININE 0.68  < > 0.74 0.98 0.85  < > = values in this interval not displayed.  Estimated Creatinine Clearance: 82.4 ml/min (by C-G formula based on Cr of 0.85).   Medical History: Past Medical History  Diagnosis Date  . A-fib   . Seizures   . Stroke 2006  . Hypertension   . Hypercholesteremia   . Blind right eye   . MI (myocardial infarction)     Assessment: 73 year old male with hx of afib on warfarin pta which has been on hold while hospitalized. Patient has been receiving sq heparin, last dose this morning.  Code blue called on patient earlier this morning, pt in sustained vt, was shocked and returned to afib. [facility] given his thrombocytopenia with pltc low but stable this morning at 61. Given recent VT and continued afib on amio will start IV heparin. INR was elevated at 1.8-2 on admission, orders to recheck in am.  Goal of Therapy:  Heparin level 0.3-0.7 units/ml Monitor platelets by anticoagulation protocol: Yes   Plan:  Give 4000 units bolus x 1 Start heparin infusion at 1000 units/hr Check anti-Xa level in 8 hours and daily while on heparin Continue to monitor H&H and platelets  Sheppard CoilFrank Sakari Alkhatib PharmD., BCPS Clinical Pharmacist Pager [phone] 03/08/2014 12:33 PM

## 2014-03-08 NOTE — Progress Notes (Signed)
No response to pain ( all extremities tested)  . No reaction to Babinski Test . Gag reflex present . No attempts at communication noted

## 2014-03-08 NOTE — Progress Notes (Signed)
STAT LTM started 

## 2014-03-08 NOTE — Progress Notes (Signed)
Thrashing of arms( bilaterally mimicing in upward/downward movements) w/head thrashing forward and backwards all noted to be more severe than previous (seizure) episodes .Propofol 10 mg given via IV bolus . Pt resting calmly w/i 2 min of propofol bolus infusion .VSS

## 2014-03-08 NOTE — Progress Notes (Signed)
SBP = 60's . HR = 52 . Amiodarone gtt stopped . tubefeeding stopped . HOB lowered to level position .E- link DR notified . NS 500 ml  IV bolus begun infusing . B/p checked q 2- 3 min .

## 2014-03-08 NOTE — Progress Notes (Signed)
Sezure activity noted involving arms ,head, upper torso ( tonic /clonic movements) . Propofol 30 mg given IV bolus stat .10:16 am . Pt lying in bed, no activity noted .Tolerating vent movements well .VSS.

## 2014-03-09 ENCOUNTER — Inpatient Hospital Stay (HOSPITAL_COMMUNITY): Payer: Medicare Other

## 2014-03-09 LAB — GLUCOSE, CAPILLARY
GLUCOSE-CAPILLARY: 113 mg/dL — AB (ref 70–99)
GLUCOSE-CAPILLARY: 160 mg/dL — AB (ref 70–99)
GLUCOSE-CAPILLARY: 162 mg/dL — AB (ref 70–99)
GLUCOSE-CAPILLARY: 172 mg/dL — AB (ref 70–99)
GLUCOSE-CAPILLARY: 218 mg/dL — AB (ref 70–99)
Glucose-Capillary: 199 mg/dL — ABNORMAL HIGH (ref 70–99)
Glucose-Capillary: 143 mg/dL — ABNORMAL HIGH (ref 70–99)
Glucose-Capillary: 133 mg/dL — ABNORMAL HIGH (ref 70–99)

## 2014-03-09 LAB — CBC WITH DIFFERENTIAL/PLATELET
BASOS ABS: 0 10*3/uL (ref 0.0–0.1)
BASOS PCT: 0 % (ref 0–1)
EOS PCT: 1 % (ref 0–5)
Eosinophils Absolute: 0.1 10*3/uL (ref 0.0–0.7)
HCT: 36.3 % — ABNORMAL LOW (ref 39.0–52.0)
Hemoglobin: 12.3 g/dL — ABNORMAL LOW (ref 13.0–17.0)
Lymphocytes Relative: 12 % (ref 12–46)
Lymphs Abs: 1 10*3/uL (ref 0.7–4.0)
MCH: 32.5 pg (ref 26.0–34.0)
MCHC: 33.9 g/dL (ref 30.0–36.0)
MCV: 96 fL (ref 78.0–100.0)
Monocytes Absolute: 1 10*3/uL (ref 0.1–1.0)
Monocytes Relative: 12 % (ref 3–12)
NEUTROS ABS: 6.1 10*3/uL (ref 1.7–7.7)
Neutrophils Relative %: 75 % (ref 43–77)
Platelets: 93 10*3/uL — ABNORMAL LOW (ref 150–400)
RBC: 3.78 MIL/uL — ABNORMAL LOW (ref 4.22–5.81)
RDW: 14.9 % (ref 11.5–15.5)
WBC: 8.2 10*3/uL (ref 4.0–10.5)

## 2014-03-09 LAB — PROTIME-INR
INR: 1.57 — ABNORMAL HIGH (ref 0.00–1.49)
PROTHROMBIN TIME: 18.8 s — AB (ref 11.6–15.2)

## 2014-03-09 LAB — BASIC METABOLIC PANEL
BUN: 23 mg/dL (ref 6–23)
CO2: 24 mEq/L (ref 19–32)
Calcium: 8.3 mg/dL — ABNORMAL LOW (ref 8.4–10.5)
Chloride: 104 mEq/L (ref 96–112)
Creatinine, Ser: 0.82 mg/dL (ref 0.50–1.35)
GFR calc Af Amer: >90 mL/min (ref >90)
GFR, EST NON AFRICAN AMERICAN: 84 mL/min — AB (ref >90)
Glucose, Bld: 150 mg/dL — ABNORMAL HIGH (ref 70–99)
POTASSIUM: 4.1 meq/L (ref 3.7–5.3)
SODIUM: 140 meq/L (ref 137–147)

## 2014-03-09 LAB — HEPARIN LEVEL (UNFRACTIONATED)
Heparin Unfractionated: 0.26 IU/mL — ABNORMAL LOW (ref 0.30–0.70)
Heparin Unfractionated: 0.22 IU/mL — ABNORMAL LOW (ref 0.30–0.70)
Heparin Unfractionated: <0.1 IU/mL — ABNORMAL LOW (ref 0.30–0.70)

## 2014-03-09 LAB — PHOSPHORUS: Phosphorus: 3.4 mg/dL (ref 2.3–4.6)

## 2014-03-09 LAB — PHENYTOIN LEVEL, TOTAL: PHENYTOIN LVL: 10.9 ug/mL (ref 10.0–20.0)

## 2014-03-09 LAB — MAGNESIUM: Magnesium: 2.2 mg/dL (ref 1.5–2.5)

## 2014-03-09 MED ORDER — HEPARIN (PORCINE) IN NACL 100-0.45 UNIT/ML-% IJ SOLN
1850.0000 [IU]/h | INTRAMUSCULAR | Status: DC
  Administered 2014-03-09: 1600 [IU]/h via INTRAVENOUS
  Administered 2014-03-10 – 2014-03-17 (×9): 1850 [IU]/h via INTRAVENOUS
  Filled 2014-03-09 (×21): qty 250

## 2014-03-09 NOTE — Progress Notes (Signed)
Pharmacy notified nurse of undetectable heparin level. Skin tear above PIV site bleeding and saturated gauze. Arm around skin tear swollen and weeping. PIV site looked ok but did not have blood return. Heparin turned off and attempted another site x2 without success. IV team has been paged.

## 2014-03-09 NOTE — Progress Notes (Signed)
ANTICOAGULATION CONSULT NOTE - Follow Up Consult  Pharmacy Consult for Heparin Indication: atrial fibrillation  No Known Allergies  Patient Measurements: Height: 6' (182.9 cm) Weight: 200 lb 6.4 oz (90.9 kg) IBW/kg (Calculated) : 77.6 Heparin Dosing Weight: 91kg  Vital Signs: Temp: 98.6 F (37 C) (06/29 1942) Temp src: Oral (06/29 1942) BP: 117/66 mmHg (06/29 1922) Pulse Rate: 62 (06/29 1942)  Labs:  Recent Labs  03/07/14 0437 03/08/14 0430  03/09/14 0443 03/09/14 1300 03/09/14 1935  HGB 12.7* 11.3*  --  12.3*  --   --   HCT 37.5* 33.6*  --  36.3*  --   --   PLT 62* 61*  --  93*  --   --   LABPROT  --   --   --  18.8*  --   --   INR  --   --   --  1.57*  --   --   HEPARINUNFRC  --   --   < > 0.26* 0.22* <0.10*  CREATININE 0.98 0.85  --  0.82  --   --   < > = values in this interval not displayed.  Estimated Creatinine Clearance: 85.4 ml/min (by C-G formula based on Cr of 0.82).   Medications:  Heparin @ 1350 units/hr  Assessment: 73yom s/p cardiac arrest/hypothermia continues on heparin for afib. Heparin level was undetectable tonight after being low goal this afternoon. D/w nurse and patient has heparin running through peripheral line that appears to have been bleeding/leaking some this evening. It's highly likely that IV is no longer placed correctly, nursing to obtain new line for heparin, will continue current rate and recheck in am.  Goal of Therapy:  Heparin level 0.3-0.7 units/ml Monitor platelets by anticoagulation protocol: Yes   Plan:  1) Continue heparin at 1600 units/hr 2) Check heparin level 8 hours after new line placed.  Sheppard CoilFrank Wilson PharmD., BCPS Clinical Pharmacist Pager [phone] 03/09/2014 8:57 PM

## 2014-03-09 NOTE — Progress Notes (Signed)
ANTICOAGULATION CONSULT NOTE - Follow Up Consult  Pharmacy Consult for Heparin  Indication: atrial fibrillation  No Known Allergies  Patient Measurements: Height: 6' (182.9 cm) Weight: 200 lb 6.4 oz (90.9 kg) IBW/kg (Calculated) : 77.6  Vital Signs: Temp: 97.9 F (36.6 C) (06/29 0437) Temp src: Oral (06/29 0437) BP: 121/62 mmHg (06/29 0400) Pulse Rate: 56 (06/29 0437)  Labs:  Recent Labs  03/07/14 0437 03/08/14 0430 03/08/14 1230 03/08/14 2030 03/09/14 0443  HGB 12.7* 11.3*  --   --   --   HCT 37.5* 33.6*  --   --   --   PLT 62* 61*  --   --   --   LABPROT  --   --   --   --  18.8*  INR  --   --   --   --  1.57*  HEPARINUNFRC  --   --  <0.10* 0.16* 0.26*  CREATININE 0.98 0.85  --   --  0.82    Estimated Creatinine Clearance: 85.4 ml/min (by C-G formula based on Cr of 0.82).   Medications:  Heparin 1200 units/hr  Assessment: 73 y/o M on heparin for afib. Plt count low and stable. HL is 0.26. Other labs as above. No issues per RN.   Goal of Therapy:  Heparin level 0.3-0.7 units/ml Monitor platelets by anticoagulation protocol: Yes   Plan:  -Increase heparin to 1350 units/hr -1330 HL -Daily CBC/HL -Monitor for bleeding  Abran DukeLedford, Leigh Kaeding 03/09/2014,5:30 AM

## 2014-03-09 NOTE — Progress Notes (Signed)
PULMONARY / CRITICAL CARE MEDICINE   Name: Kyle [name] MRN: [id number] DOB: 1941-08-22    ADMISSION DATE:  02/17/2014 CONSULTATION DATE:  02/24/2014  REFERRING MD :  EDP PRIMARY SERVICE: PCCM  CHIEF COMPLAINT:  Cardiac arrest  BRIEF PATIENT DESCRIPTION: 73 year old male with PMH MI, CVA, A-fib, and seizures suffered witnessed cardiac arrest 6/24.  Initial rhythm VF. ROSC achieved with defibrillation, CPR and amiodarone. In ED found to have ECG changes. PCCM asked to see for admission.   SIGNIFICANT EVENTS / STUDIES:  6/24 adm after VF arrest. Hypothermia initiated 6/24 CT head: No acute intracranial process. Remote CVA 6/24 CTA chest - No pulmonary emboli. 10 mm nodule in RUL. Multiple bilateral anterior rib fractures. 6/24 LHC: severe 2 vessel obstructive CAD - not amenable to PCI. Severe LV dysfunction (LVEF 25%). Elevated LV pressures. Rec med rx. Consider CABG in future 6/25 TTE:  LVEF 30-35%, mod dil LV, akinesis of entire inf myocardium, basal-mid inferolateral myocardium, sclerosis of aortic valve, mod dil aorta and mod dil LA  6/27: rewarmed. PEr elink report: myoclonus and therefore versed started. Per Nursing: opened eyes, has gag but not following commands -? On sedation gtt. Formal WUA in progress. Also, amio started for a flutter/a tach    03/08/14: Intermittent seizures controlled by diprivan. Not responsive on wua. EEG ordered.  CT head - no anoxia, remote posterior LMCA stroke. Off amio gtt; still in A Fib  LINES / TUBES: R - port-a-cath (chronic) [facility] CVL 6/24 >>> ETT 6/24 >>>  CULTURES: Urine 6/24 >>> NEG MRSA PCR 6/24 >>> NEG  ANTIBIOTICS: None  SUBJECTIVE:   03/09/14: V T arrestyesterday.  EEG with diprivian in progress. Also on diprivan and keppra and per neuro no seizures. No family at bedside  VITAL SIGNS: Temp:  [97.6 F (36.4 C)-98.9 F (37.2 C)] 97.6 F (36.4 C) (06/29 0730) Pulse Rate:  [34-91] 57 (06/29 1154) Resp:  [14-24] 19 (06/29  1154) BP: (64-162)/(34-88) 111/53 mmHg (06/29 1154) SpO2:  [95 %-100 %] 97 % (06/29 1154) FiO2 (%):  [30 %] 30 % (06/29 1154) Weight:  [90.9 kg (200 lb 6.4 oz)] 90.9 kg (200 lb 6.4 oz) (06/29 0400)  HEMODYNAMICS:    VENTILATOR SETTINGS: Vent Mode:  [-] PRVC FiO2 (%):  [30 %] 30 % Set Rate:  [14 bmp] 14 bmp Vt Set:  [550 mL] 550 mL PEEP:  [5 cmH20] 5 cmH20 Plateau Pressure:  [14 cmH20-17 cmH20] 16 cmH20  INTAKE / OUTPUT: Intake/Output     06/28 0701 - 06/29 0700 06/29 0701 - 06/30 0700   I.V. (mL/kg) 3516.4 (38.7) 650.5 (7.2)   NG/GT 1170 415   IV Piggyback 805 110   Total Intake(mL/kg) 5491.4 (60.4) 1175.5 (12.9)   Urine (mL/kg/hr) 1775 (0.8) 300 (0.6)   Total Output 1775 300   Net +3716.4 +875.5         PHYSICAL EXAMINATION: General: Looks critically ill Neuro: RASS -4 on diprivan.   HEENT:  [place]/AT, pupils asymmetric with R cataract Cardiovascular: IRIR, no M noted Lungs:  Clear anteriorly Abdomen:  Soft, non-distended. BS + Ext. Mild peripheral edema.  LABS:  PULMONARY  Recent Labs Lab 03/09/2014 1950  03/05/14 0154 03/05/14 0444 03/05/14 0636 03/05/14 0922 03/06/14 0500  PHART 7.218*  --  7.425  --   --  7.419 7.410  PCO2ART 59.5*  --  31.2*  --   --  33.8* 31.6*  PO2ART 449.0*  --  191.0*  --   --  188.0*  113.0*  HCO3 24.2*  --  21.4  --   --  22.8 20.4  TCO2 26  < > 23 21 22 24  21.5  O2SAT 100.0  --  100.0  --   --  100.0 98.8  < > = values in this interval not displayed.  CBC  Recent Labs Lab 03/07/14 0437 03/08/14 0430 03/09/14 0443  HGB 12.7* 11.3* 12.3*  HCT 37.5* 33.6* 36.3*  WBC 6.3 4.8 8.2  PLT 62* 61* 93*    COAGULATION  Recent Labs Lab 02/19/2014 1949 03/05/14 0055 03/05/14 1430 03/09/14 0443  INR 1.83* 1.89* 2.01* 1.57*    CARDIAC    Recent Labs Lab 02/12/2014 1949  TROPONINI <0.30    Recent Labs Lab 03/05/14 0307  PROBNP 1114.0*     CHEMISTRY  Recent Labs Lab 03/05/14 0300  03/06/14 0230 03/06/14 0400  03/07/14 0437 03/07/14 0924 03/08/14 0430 03/09/14 0443  NA 137  < > 140 141 139  --  138 140  K 3.6*  < > 3.9 3.9 4.2  --  3.8 4.1  CL 99  < > 106 105 106  --  104 104  CO2 22  < > 21 20 21   --  24 24  GLUCOSE 195*  < > 168* 154* 151*  --  148* 150*  BUN 14  < > 14 14 19   --  25* 23  CREATININE 0.75  < > 0.72 0.74 0.98  --  0.85 0.82  CALCIUM 8.6  < > 8.3* 8.5 8.2*  --  8.4 8.3*  MG 1.7  --   --  1.6  --  2.1 2.0 2.2  PHOS 2.0*  --   --  3.5  --  1.8* 1.9* 3.4  < > = values in this interval not displayed. Estimated Creatinine Clearance: 85.4 ml/min (by C-G formula based on Cr of 0.82).   LIVER  Recent Labs Lab 03/07/2014 1949 03/05/14 0055 03/05/14 1430 03/09/14 0443  AST 70*  --   --   --   ALT 59*  --   --   --   ALKPHOS 69  --   --   --   BILITOT 0.6  --   --   --   PROT 5.4*  --   --   --   ALBUMIN 3.4*  --   --   --   INR 1.83* 1.89* 2.01* 1.57*     INFECTIOUS  Recent Labs Lab 03/05/14 2000 03/06/14 0235 03/07/14 0807  LATICACIDVEN 3.0* 2.7* 1.1     ENDOCRINE CBG (last 3)   Recent Labs  03/09/14 0326 03/09/14 0736 03/09/14 1153  GLUCAP 172* 113* 160*         IMAGING x48h  Ct Head Wo Contrast  03/07/2014   CLINICAL DATA:  Evaluate for anoxic brain injury, status post cardiac arrest. Atrial fibrillation. Seizures. CVA. Hypertension.  EXAM: CT HEAD WITHOUT CONTRAST  TECHNIQUE: Contiguous axial images were obtained from the base of the skull through the vertex without intravenous contrast.  COMPARISON:  02/16/2014  FINDINGS: Sinuses/Soft tissues: Endotracheal tube. Mild motion degradation. Mucosal thickening of the left maxillary sinus with minimal fluid within. Hypoplastic frontal sinuses.  Intracranial: Mild cerebral atrophy. Posterior left MCA distribution remote cortically based infarct. Mild low density in the periventricular white matter likely related to small vessel disease. Gray-white differentiation maintained. Basal cisterns patent.  No  mass lesion, hemorrhage, hydrocephalus, acute infarct, intra-axial, or extra-axial fluid collection.  IMPRESSION: 1. No  evidence of anoxic brain injury. 2. Mildly motion degraded exam. 3. Remote posterior left MCA distribution cortically based infarct. 4. Sinus disease.   Electronically Signed   By: Jeronimo Greaves M.D.   On: 03/07/2014 13:15   Dg Chest Port 1 View  03/09/2014   CLINICAL DATA:  Intubated patient.  Status post cardiac arrest.  EXAM: PORTABLE CHEST - 1 VIEW  COMPARISON:  Plain film of the chest 03/08/2014 and 03/07/2014. CT chest 2014/03/10.  FINDINGS: Support tubes and lines are unchanged. Hazy opacity projecting over the right lower lung zone is compatible with a layering pleural effusion and mild basilar atelectasis. Subsegmental atelectasis in the left lung base is unchanged. There is cardiomegaly. No pneumothorax.  IMPRESSION: Bibasilar atelectasis with hazy opacity over the right lower lung zone compatible with a small effusion.   Electronically Signed   By: Drusilla Kanner M.D.   On: 03/09/2014 07:56   Dg Chest Port 1 View  03/08/2014   CLINICAL DATA:  Evaluate endotracheal tube.  EXAM: PORTABLE CHEST - 1 VIEW  COMPARISON:  1 day prior  FINDINGS: Endotracheal tube terminates 6.5 cm above carina. Nasogastric extends beyond the inferior aspect of the film. Right sided Port-A-Cath terminates at low SVC.  The right costophrenic angle minimally is excluded. Left internal jugular line unchanged. Midline trachea. moderate cardiomegaly. No pleural effusion or pneumothorax. Similar patchy bibasilar airspace disease.  IMPRESSION: No significant change in patchy bibasilar Airspace disease, likely atelectasis.   Electronically Signed   By: Jeronimo Greaves M.D.   On: 03/08/2014 07:30   Dg Chest Port 1 View  03/07/2014   CLINICAL DATA:  Endotracheal tube position.  EXAM: PORTABLE CHEST - 1 VIEW  COMPARISON:  03/07/2014 at 1009 hr  FINDINGS: Endotracheal tube terminates at the level of the clavicular  heads, well above the carina, much more proximally than on the prior study. Right jugular Port-A-Cath and left jugular central venous catheter are unchanged. Enteric tube courses into the left upper abdomen.  Cardiac silhouette remains enlarged, unchanged. Retrocardiac left lower lobe opacity does not appear significantly changed. No pleural effusion or pneumothorax is identified.  IMPRESSION: 1. Interval retraction of endotracheal tube as above. 2. Unchanged left lower lobe atelectasis versus infiltrate.   Electronically Signed   By: Sebastian Ache   On: 03/07/2014 19:58      ASSESSMENT / PLAN:  PULMONARY A: Acute respiratory failure 2nd to cardiac arrest H/o COPD Lung nodule on CT   - does not meet sbt/extubation criteria due to mental status.    P:   Cont full vent support - settings reviewed and/or adjusted Cont vent bundle Daily SBT if/when meets criteria  CARDIOVASCULAR A:  VF arrest  Hypertension Atrial fib Critical CAD   -. Offf pressors x 72h. Had VT 03/08/14 and shocked. Back on amio P:  MAP goal to 65 mmHg Cards following Consider CABG if neuro recovery good  RENAL A:   Normal currentlu  P:   Replete low phos Monitor BMET ,   Monitor I/Os Correct electrolytes as indicated  GASTROINTESTINAL A:   No issues P:   SUP: IV PPI TFs 6/26  HEMATOLOGIC A:   Chronic warfarin therapy for CAF Thrombocytopenia of uncertain etiology P:  DVT px: SCDs Monitor CBC intermittently Transfuse per usual ICU guidelines Holding warfarin  INFECTIOUS A:  No overt infections P:   Micro and abx as above Monitor WBC, temp  ENDOCRINE A:    Hyperglycemia without prior DM P:   Cont SSI  NEUROLOGIC A:   Acute encephalopathy, post anoxic H/o seizures, CVA   - having seizures - controlled with diprivan, dilantin and  Keprra. . CT head no anoxia but has old L MCA stroke. ? Seizures due to anoxia v old stroke; suspect due to old stroke. Neuro concerned for anoxic  injury and suspect poor prognosis  P:   Diprivan gtt as needed; monitor lactate and ck prn PRN fentanyl only Daily WUA EEG pending formal report  03/08/14: Son updated at bedside. T 03/09/14: No family at bedside    The patient is critically ill with multiple organ systems failure and requires high complexity decision making for assessment and support, frequent evaluation and titration of therapies, application of advanced monitoring technologies and extensive interpretation of multiple databases.   Critical Care Time devoted to patient care services described in this note is  35  Minutes.  Dr. Kalman ShanMurali Ramaswamy, M.D., [facility] HospitalF.C.C.P Pulmonary and Critical Care Medicine Staff Physician [facility] Pager: [phone], If no answer or between  15:00h - 7:00h: call [phone]  03/09/2014 12:26 PM

## 2014-03-09 NOTE — Progress Notes (Signed)
ANTICOAGULATION CONSULT NOTE - Follow Up Consult  Pharmacy Consult for Heparin Indication: atrial fibrillation  No Known Allergies  Patient Measurements: Height: 6' (182.9 cm) Weight: 200 lb 6.4 oz (90.9 kg) IBW/kg (Calculated) : 77.6 Heparin Dosing Weight: 91kg  Vital Signs: Temp: 97.9 F (36.6 C) (06/29 1227) Temp src: Oral (06/29 1227) BP: 108/53 mmHg (06/29 1300) Pulse Rate: 58 (06/29 1300)  Labs:  Recent Labs  03/07/14 0437 03/08/14 0430  03/08/14 2030 03/09/14 0443 03/09/14 1300  HGB 12.7* 11.3*  --   --  12.3*  --   HCT 37.5* 33.6*  --   --  36.3*  --   PLT 62* 61*  --   --  93*  --   LABPROT  --   --   --   --  18.8*  --   INR  --   --   --   --  1.57*  --   HEPARINUNFRC  --   --   < > 0.16* 0.26* 0.22*  CREATININE 0.98 0.85  --   --  0.82  --   < > = values in this interval not displayed.  Estimated Creatinine Clearance: 85.4 ml/min (by C-G formula based on Cr of 0.82).   Medications:  Heparin @ 1350 units/hr  Assessment: 73yom s/p cardiac arrest/hypothermia continues on heparin for afib. Heparin level remains below goal despite rate increase this morning. No issues with infusion. CBC is stable and platelets are low but improving. No bleeding reported.  Goal of Therapy:  Heparin level 0.3-0.7 units/ml Monitor platelets by anticoagulation protocol: Yes   Plan:  1) Increase heparin to 1600 units/hr 2) Check heparin level 8 hours after rate change  Fredrik RiggerMarkle, Jennifer Sue 03/09/2014,1:48 PM

## 2014-03-09 NOTE — Progress Notes (Signed)
SUBJECTIVE:  73 y/o male with previous CVA admitted witnesses OOH arrest requiring CPR and cooling. 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 His anatomy is not favorable for PCI. He will need consideration for CABG if he recovers neurological function. LVEF is 25% by LV gram. Echo with LVEF=30-35%.   Remains Intubated. Sedated. Nonresponsive. Continues to be monitored with EEG.  Repeat brain CT 6/27: IMPRESSION:  1. No evidence of anoxic brain injury.  2. Mildly motion degraded exam.  3. Remote posterior left MCA distribution cortically based infarct.  4. Sinus disease.    VT arrest on 6/28.  Shocked with 150J and quickly returned to his baseline AF.      Tele: sinus brady, PVCs  BP 114/56  Pulse 52  Temp(Src) 97.6 F (36.4 C) (Oral)  Resp 14  Ht 6' (1.829 m)  Wt 200 lb 6.4 oz (90.9 kg)  BMI 27.17 kg/m2  SpO2 98%  Intake/Output Summary (Last 24 hours) at 03/09/14 0841 Last data filed at 03/09/14 0800  Gross per 24 hour  Intake 5433.79 ml  Output   1765 ml  Net 3668.79 ml    PHYSICAL EXAM General: Intubated. Does not respond to voice or pain. Involuntary eyelid twitching Neck: No JVD. No masses noted.  Lungs: Mechanical breath sounds bilaterally.  Heart: Irregular with no murmurs noted.  Abdomen: Bowel sounds are present. Soft, non-tender.  Extremities: No lower extremity edema. 3+ PT pulses bilaterally  LABS: Basic Metabolic Panel:  Recent Labs  16/06/9605/28/15 0430 03/09/14 0443  NA 138 140  K 3.8 4.1  CL 104 104  CO2 24 24  GLUCOSE 148* 150*  BUN 25* 23  CREATININE 0.85 0.82  CALCIUM 8.4 8.3*  MG 2.0 2.2  PHOS 1.9* 3.4   CBC:  Recent Labs  03/08/14 0430 03/09/14 0443  WBC 4.8 8.2  NEUTROABS 3.8 6.1  HGB 11.3* 12.3*  HCT 33.6* 36.3*  MCV 94.9 96.0  PLT 61* 93*   Cardiac Enzymes: No results found for this basename: CKTOTAL, CKMB, CKMBINDEX, TROPONINI,  in the last 72 hours Current Meds: . antiseptic oral rinse  15 mL Mouth Rinse  QID  . aspirin  81 mg Per Tube Daily  . atorvastatin  80 mg Per NG tube q1800  . carvedilol  3.125 mg Oral BID WC  . chlorhexidine  15 mL Mouth Rinse BID  . insulin aspart  0-15 Units Subcutaneous 6 times per day  . ipratropium-albuterol  3 mL Nebulization Q6H  . levETIRAcetam  1,000 mg Intravenous Q12H  . lisinopril  5 mg Oral Daily  . pantoprazole sodium  40 mg Per Tube Daily  . phenytoin (DILANTIN) IV  100 mg Intravenous 3 times per day   Cardiac cath 03/05/14:  Left mainstem: There is diffuse calcification with 40% ostial left main disease. The left main tapers distally to 30%.  Left anterior descending (LAD): The LAD is a large vessel that gives rise to a moderately large diagonal branch. In the proximal LAD there is an aneurysmal segment followed immediately by a severe bifurcation stenosis 1:1:1 in the LAD and first diagonal up to 95%.  There is a moderately large ramus branch without significant disease.  Left circumflex (LCx): The LCx has mild ostial disease but otherwise has no significant stenosis.  Right coronary artery (RCA): Dominant vessel with 70% proximal stenosis. The crux of the RCA is severely diseased up to 90%. The Distal RCA is occluded with right to right and left to right  collaterals.  Left ventriculography: Left ventricular systolic function is abnormal, LV size is increased with extensive inferior akinesis and global hypokinesis. LVEF is estimated at 25%, there is mild to moderate mitral regurgitation   Echo 03/05/14: Left ventricle: The cavity size was moderately dilated. Systolic function was moderately to severely reduced. The estimated ejection fraction was in the range of 30% to 35%. There is akinesis of the entireinferior myocardium. There is akinesis of the basal-midinferolateral myocardium. - Aortic valve: Moderate thickening and calcification, consistent with sclerosis. There was trivial regurgitation. - Ascending aorta: The ascending aorta was mildly  dilated. - Left atrium: The atrium was mildly dilated.  ASSESSMENT AND PLAN:   1. Ventricular fibrillation arrest, out of hospital: Witnessed arrest at home. Likely primary arrythmia event with ischemic cardiomyopathy. Bystander CPR. Admitted by CCM. Pt found to have high grade CAD with complex disease involving the LAD/Diagonal bifurcation and severe disease in the RCA but these lesions were not acute. No PCI performed. S/p Cooling protocol per PCCM. Recurrent VT on 6/28. Shocked. Stopped amio due to bradycardia with seizure meds. K 4.1 mg 2.2.   2. CAD: Severe double vessel disease by cath 03/05/14. His anatomy is not favorable for PCI. He will need consideration for CABG if he recovers neurological function.On ASA. b-blocker stopped due to severe bradycardia earlier. LVEF is 25% by LV gram. Echo with LVEF=30-35%.     3. Ischemic cardiomyopathy: As above. Will follow for now. No signs of ADHF. Continue ACe/b-blocker.   4. Atrial fib/AFL: rate controlled. On amio for VT. Continue heparin  5. Possible anoxic brain injury:  Neuro note states that "Prognosis is guarded at best but likely poor for independent functional recovery."    6. H/o CVA  The patient is critically ill with multiple organ systems failure and requires high complexity decision making for assessment and support, frequent evaluation and titration of therapies, application of advanced monitoring technologies and extensive interpretation of multiple databases.       Corky CraftsVARANASI,JAYADEEP S. MD  6/29/20158:41 AM

## 2014-03-09 NOTE — Progress Notes (Signed)
Subjective: No recurrent clinical seizure activity reported. No seizure activity also noted along continuous EEG monitoring. Official report pending.  Objective: Current vital signs: BP 107/55  Pulse 56  Temp(Src) 97.6 F (36.4 C) (Oral)  Resp 18  Ht 6' (1.829 m)  Wt 90.9 kg (200 lb 6.4 oz)  BMI 27.17 kg/m2  SpO2 99%  Neurologic Exam: Patient remains intubated and on mechanical ventilation and unresponsive including to noxious stimuli. No response noted with suctioning, including no gag reflex. Left pupil did not react to light. Right cornea opacified. Extraocular movements are absent with oculocephalic maneuvers. Corneal reflexes were absent. No facial weakness was noted. Muscle tone was flaccid throughout. Deep tendon reflexes were absent throughout. Responses were mute bilaterally.  Medications: I have reviewed the patient's current medications.  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 Patient is showing no indication of improvement since cardiac arrest on 02/13/2014. She is known seizure disorder and has had recurrent seizure activity. Seizures are currently controlled with Dilantin and Keppra, as well as propofol drip IV.  I will plan to start weaning propofol in order to better assess patient's overall prognosis. Hopefully seizures can be controlled as propofol was being tapered. I will adjust anti-epileptic drugs as needed.  We will continue to follow this patient closely with you.  C.R. Roseanne RenoStewart, MD [facility] [phone]  03/09/2014  10:22 AM

## 2014-03-10 ENCOUNTER — Inpatient Hospital Stay (HOSPITAL_COMMUNITY): Payer: Medicare Other

## 2014-03-10 LAB — BASIC METABOLIC PANEL
BUN: 21 mg/dL (ref 6–23)
CHLORIDE: 106 meq/L (ref 96–112)
CO2: 26 meq/L (ref 19–32)
CREATININE: 0.77 mg/dL (ref 0.50–1.35)
Calcium: 8 mg/dL — ABNORMAL LOW (ref 8.4–10.5)
GFR calc Af Amer: >90 mL/min (ref >90)
GFR calc non Af Amer: 87 mL/min — ABNORMAL LOW (ref >90)
Glucose, Bld: 156 mg/dL — ABNORMAL HIGH (ref 70–99)
POTASSIUM: 4.4 meq/L (ref 3.7–5.3)
Sodium: 142 mEq/L (ref 137–147)

## 2014-03-10 LAB — GLUCOSE, CAPILLARY
GLUCOSE-CAPILLARY: 141 mg/dL — AB (ref 70–99)
GLUCOSE-CAPILLARY: 142 mg/dL — AB (ref 70–99)
Glucose-Capillary: 154 mg/dL — ABNORMAL HIGH (ref 70–99)
Glucose-Capillary: 136 mg/dL — ABNORMAL HIGH (ref 70–99)
Glucose-Capillary: 155 mg/dL — ABNORMAL HIGH (ref 70–99)
Glucose-Capillary: 154 mg/dL — ABNORMAL HIGH (ref 70–99)
Glucose-Capillary: 130 mg/dL — ABNORMAL HIGH (ref 70–99)

## 2014-03-10 LAB — PHOSPHORUS: Phosphorus: 2.6 mg/dL (ref 2.3–4.6)

## 2014-03-10 LAB — CBC WITH DIFFERENTIAL/PLATELET
Basophils Absolute: 0 10*3/uL (ref 0.0–0.1)
Basophils Relative: 0 % (ref 0–1)
Eosinophils Absolute: 0 10*3/uL (ref 0.0–0.7)
Eosinophils Relative: 1 % (ref 0–5)
HCT: 33.3 % — ABNORMAL LOW (ref 39.0–52.0)
HEMOGLOBIN: 11.2 g/dL — AB (ref 13.0–17.0)
LYMPHS ABS: 0.5 10*3/uL — AB (ref 0.7–4.0)
LYMPHS PCT: 11 % — AB (ref 12–46)
MCH: 32.6 pg (ref 26.0–34.0)
MCHC: 33.6 g/dL (ref 30.0–36.0)
MCV: 96.8 fL (ref 78.0–100.0)
MONOS PCT: 11 % (ref 3–12)
Monocytes Absolute: 0.5 10*3/uL (ref 0.1–1.0)
NEUTROS PCT: 76 % (ref 43–77)
Neutro Abs: 3.6 10*3/uL (ref 1.7–7.7)
PLATELETS: 66 10*3/uL — AB (ref 150–400)
RBC: 3.44 MIL/uL — AB (ref 4.22–5.81)
RDW: 15.1 % (ref 11.5–15.5)
WBC: 4.7 10*3/uL (ref 4.0–10.5)

## 2014-03-10 LAB — HEPARIN LEVEL (UNFRACTIONATED)
HEPARIN UNFRACTIONATED: 0.15 [IU]/mL — AB (ref 0.30–0.70)
Heparin Unfractionated: 0.43 IU/mL (ref 0.30–0.70)
Heparin Unfractionated: 0.52 IU/mL (ref 0.30–0.70)

## 2014-03-10 LAB — TRIGLYCERIDES: Triglycerides: 60 mg/dL (ref <150)

## 2014-03-10 LAB — MAGNESIUM: Magnesium: 1.9 mg/dL (ref 1.5–2.5)

## 2014-03-10 MED ORDER — MIDAZOLAM HCL 2 MG/2ML IJ SOLN
1.0000 mg | INTRAMUSCULAR | Status: AC | PRN
  Administered 2014-03-14 (×3): 1 mg via INTRAVENOUS
  Filled 2014-03-10 (×2): qty 2

## 2014-03-10 MED ORDER — MAGNESIUM SULFATE 40 MG/ML IJ SOLN
2.0000 g | Freq: Once | INTRAMUSCULAR | Status: AC
  Administered 2014-03-10: 2 g via INTRAVENOUS
  Filled 2014-03-10: qty 50

## 2014-03-10 MED ORDER — VITAL AF 1.2 CAL PO LIQD
1000.0000 mL | ORAL | Status: DC
  Administered 2014-03-10 – 2014-03-16 (×5): 1000 mL
  Filled 2014-03-10 (×8): qty 1000

## 2014-03-10 MED ORDER — PROPOFOL 10 MG/ML IV EMUL
INTRAVENOUS | Status: AC
  Administered 2014-03-10: 20 ug/kg/min via INTRAVENOUS
  Filled 2014-03-10: qty 100

## 2014-03-10 MED ORDER — FENTANYL CITRATE 0.05 MG/ML IJ SOLN
50.0000 ug | INTRAMUSCULAR | Status: DC | PRN
  Administered 2014-03-13 – 2014-03-14 (×7): 50 ug via INTRAVENOUS
  Filled 2014-03-10 (×7): qty 2

## 2014-03-10 MED ORDER — MIDAZOLAM HCL 2 MG/2ML IJ SOLN
1.0000 mg | INTRAMUSCULAR | Status: DC | PRN
  Administered 2014-03-13 – 2014-03-14 (×4): 1 mg via INTRAVENOUS
  Filled 2014-03-10 (×6): qty 2

## 2014-03-10 MED ORDER — PROPOFOL 10 MG/ML IV EMUL
0.0000 ug/kg/min | INTRAVENOUS | Status: DC
Start: 2014-03-10 — End: 2014-03-13
  Administered 2014-03-10 – 2014-03-11 (×2): 20 ug/kg/min via INTRAVENOUS
  Filled 2014-03-10: qty 100

## 2014-03-10 NOTE — Progress Notes (Addendum)
PULMONARY / CRITICAL CARE MEDICINE   Name: Kyle [name] MRN: [id number] DOB: December 28, 1940    ADMISSION DATE:  02/11/2014 CONSULTATION DATE:  03/09/2014  REFERRING MD :  EDP PRIMARY SERVICE: PCCM  CHIEF COMPLAINT:  Cardiac arrest  BRIEF PATIENT DESCRIPTION: 73 year old male with PMH MI, CVA, A-fib, and seizures suffered witnessed cardiac arrest 6/24.  Initial rhythm VF. ROSC achieved with defibrillation, CPR and amiodarone. In ED found to have ECG changes. PCCM asked to see for admission.   SIGNIFICANT EVENTS / STUDIES:  6/24 adm after VF arrest. Hypothermia initiated 6/24 CT head: No acute intracranial process. Remote CVA 6/24 CTA chest - No pulmonary emboli. 10 mm nodule in RUL. Multiple bilateral anterior rib fractures. 6/24 LHC: severe 2 vessel obstructive CAD - not amenable to PCI. Severe LV dysfunction (LVEF 25%). Elevated LV pressures. Rec med rx. Consider CABG in future 6/25 TTE:  LVEF 30-35%, mod dil LV, akinesis of entire inf myocardium, basal-mid inferolateral myocardium, sclerosis of aortic valve, mod dil aorta and mod dil LA  6/27: rewarmed. PEr elink report: myoclonus and therefore versed started. Per Nursing: opened eyes, has gag but not following commands -? On sedation gtt. Formal WUA in progress. Also, amio started for a flutter/a tach  03/08/14: Intermittent seizures controlled by diprivan. Not responsive on wua. EEG ordered.  CT head - no anoxia, remote posterior LMCA stroke. Off amio gtt; still in A Fib   03/09/14: V T arrestyesterday.  EEG with diprivian in progress. Also on diprivan and keppra and per neuro no seizures. No family at bedside  LINES / TUBES: R - port-a-cath (chronic) [facility]J CVL 6/24 >>> ETT 6/24 >>>  CULTURES: Urine 6/24 >>> NEG MRSA PCR 6/24 >>> NEG  ANTIBIOTICS: None  SUBJECTIVE:   03/10/14: On keppra and dilantin and very low dose diprivan: neuro feels he is better with no seizures and regain in corneal, gag and pupil, RASS -3  VITAL  SIGNS: Temp:  [97.8 F (36.6 C)-99.8 F (37.7 C)] 99.7 F (37.6 C) (06/30 0900) Pulse Rate:  [56-73] 58 (06/30 0900) Resp:  [13-27] 26 (06/30 0900) BP: (102-127)/(50-66) 112/54 mmHg (06/30 0900) SpO2:  [95 %-100 %] 98 % (06/30 0900) FiO2 (%):  [30 %] 30 % (06/30 0808) Weight:  [93.3 kg (205 lb 11 oz)] 93.3 kg (205 lb 11 oz) (06/30 0313)  HEMODYNAMICS:    VENTILATOR SETTINGS: Vent Mode:  [-] PRVC FiO2 (%):  [30 %] 30 % Set Rate:  [14 bmp] 14 bmp Vt Set:  [550 mL] 550 mL PEEP:  [5 cmH20] 5 cmH20 Plateau Pressure:  [15 cmH20-23 cmH20] 23 cmH20  INTAKE / OUTPUT: Intake/Output     06/29 0701 - 06/30 0700 06/30 0701 - 07/01 0700   I.V. (mL/kg) 2732.6 (29.3) 96 (1)   NG/GT 1650 95   IV Piggyback 220    Total Intake(mL/kg) 4602.6 (49.3) 191 (2)   Urine (mL/kg/hr) 1650 (0.7)    Total Output 1650     Net +2952.6 +191         PHYSICAL EXAMINATION: General: Looks critically ill Neuro: On keppra and dilantin and very low dose diprivan: neuro feels he is better with no seizures and regain in corneal, gag and pupil, RASS -3 HEENT:  [place]/AT, pupils asymmetric with R cataract Cardiovascular: IRIR, no M noted Lungs:  Clear anteriorly Abdomen:  Soft, non-distended. BS + Ext. Mild peripheral edema.  LABS:  PULMONARY  Recent Labs Lab 02/25/2014 1950  03/05/14 0154 03/05/14 0444 03/05/14 0636 03/05/14  0922 03/06/14 0500  PHART 7.218*  -9604-  7.425  --   --  7.419 7.410  PCO2ART 59.5*  --  31.2*  --   --  33.8* 31.6*  PO2ART 449.0*  --  191.0*  --   --  188.0* 113.0*  HCO3 24.2*  --  21.4  --   --  22.8 20.4  TCO2 26  < > 23 21 22 24  21.5  O2SAT 100.0  --  100.0  --   --  100.0 98.8  < > = values in this interval not displayed.  CBC  Recent Labs Lab 03/08/14 0430 03/09/14 0443 03/10/14 0615  HGB 11.3* 12.3* 11.2*  HCT 33.6* 36.3* 33.3*  WBC 4.8 8.2 4.7  PLT 61* 93* 66*    COAGULATION  Recent Labs Lab 03/05/2014 1949 03/05/14 0055 03/05/14 1430 03/09/14 0443  INR  1.83* 1.89* 2.01* 1.57*    CARDIAC    Recent Labs Lab 02/22/2014 1949  TROPONINI <0.30    Recent Labs Lab 03/05/14 0307  PROBNP 1114.0*     CHEMISTRY  Recent Labs Lab 03/06/14 0400 03/07/14 0437 03/07/14 0924 03/08/14 0430 03/09/14 0443 03/10/14 0615  NA 141 139  --  138 140 142  K 3.9 4.2  --  3.8 4.1 4.4  CL 105 106  --  104 104 106  CO2 20 21  --  24 24 26   GLUCOSE 154* 151*  --  148* 150* 156*  BUN 14 19  --  25* 23 21  CREATININE 0.74 0.98  --  0.85 0.82 0.77  CALCIUM 8.5 8.2*  --  8.4 8.3* 8.0*  MG 1.6  --  2.1 2.0 2.2 1.9  PHOS 3.5  --  1.8* 1.9* 3.4 2.6   Estimated Creatinine Clearance: 94.7 ml/min (by C-G formula based on Cr of 0.77).   LIVER  Recent Labs Lab 02/28/2014 1949 03/05/14 0055 03/05/14 1430 03/09/14 0443  AST 70*  --   --   --   ALT 59*  --   --   --   ALKPHOS 69  --   --   --   BILITOT 0.6  --   --   --   PROT 5.4*  --   --   --   ALBUMIN 3.4*  --   --   --   INR 1.83* 1.89* 2.01* 1.57*     INFECTIOUS  Recent Labs Lab 03/05/14 2000 03/06/14 0235 03/07/14 0807  LATICACIDVEN 3.0* 2.7* 1.1     ENDOCRINE CBG (last 3)   Recent Labs  03/09/14 2325 03/10/14 0412 03/10/14 0743  GLUCAP 154* 136* 141*         IMAGING x48h  Dg Chest Port 1 View  03/10/2014   CLINICAL DATA:  Endotracheal tube position  EXAM: PORTABLE CHEST - 1 VIEW  COMPARISON:  03/09/2014  FINDINGS: Endotracheal tube approximately 4 cm above the carina. Left jugular catheter tip in the SVC. Port-A-Cath tip at the cavoatrial junction unchanged. NG tube enters the stomach.  Progression of bibasilar opacities consistent with atelectasis and effusion. Question fluid overload with pulmonary vascular congestion noted.  IMPRESSION: Worsening aeration in the lung bases consistent with increasing bibasilar atelectasis and effusions. Question fluid overload   Electronically Signed   By: Marlan Palauharles  Clark M.D.   On: 03/10/2014 07:15   Dg Chest Port 1  View  03/09/2014   CLINICAL DATA:  Intubated patient.  Status post cardiac arrest.  EXAM: PORTABLE CHEST - 1 VIEW  COMPARISON:  Plain film of the chest 03/08/2014 and 03/07/2014. CT chest 03/03/2014.  FINDINGS: Support tubes and lines are unchanged. Hazy opacity projecting over the right lower lung zone is compatible with a layering pleural effusion and mild basilar atelectasis. Subsegmental atelectasis in the left lung base is unchanged. There is cardiomegaly. No pneumothorax.  IMPRESSION: Bibasilar atelectasis with hazy opacity over the right lower lung zone compatible with a small effusion.   Electronically Signed   By: Drusilla Kannerhomas  Dalessio M.D.   On: 03/09/2014 07:56      ASSESSMENT / [name]:  PULMONARY A: Acute respiratory failure 2nd to cardiac arrest H/o COPD Lung nodule on CT   - does not meet sbt/extubation criteria due to mental status.    P:   Cont full vent support - settings reviewed and/or adjusted Cont vent bundle Daily SBT if/when meets criteria  CARDIOVASCULAR A:  VF arrest  Hypertension Atrial fib Critical CAD   -.  Had VT 03/08/14 and shocked. Back  levophed but also on coreg and lisinoprol - 112/54.  Off amio.  Also on heparin IV gtt P:   DC ace inhibitor, hydralazine and coreg because of levophed need; restart when off levophed MAP goal to 65 mmHg Cards following Consider CABG if neuro recovery good  RENAL A:   Mild low mag  P:   Replete low phos Monitor BMET ,   Monitor I/Os Correct electrolytes as indicated  GASTROINTESTINAL A:   No issues P:   SUP: IV PPI TFs 6/26  HEMATOLOGIC A:   Chronic warfarin therapy for CAF Thrombocytopenia of uncertain etiology P:  DVT px: SCDs Monitor CBC intermittently Transfuse per usual ICU guidelines Holding warfarin but on IV heparin gtt per cards  INFECTIOUS A:  No overt infections P:   Micro and abx as above Monitor WBC, temp  ENDOCRINE A:    Hyperglycemia without prior DM P:   Cont  SSI  NEUROLOGIC A:   Acute encephalopathy, post anoxic H/o seizures, CVA   - having seizures 6/27 and 03/08/14  - On 03/10/14: Neuro feel seizures controlled with dilantin and keppra and he i some better with return of reflexes  P:   Dc Diprivan  PRN fentanyl only Daily WUA   03/08/14: Kyle updated at bedside. T 03/09/14: No family at bedside 03/10/14: Kyle PlanSon [name] updated over phone. [name] to monitor over next few to several days for wakefulness before deciding further goals    The patient is critically ill with multiple organ systems failure and requires high complexity decision making for assessment and support, frequent evaluation and titration of therapies, application of advanced monitoring technologies and extensive interpretation of multiple databases.   Critical Care Time devoted to patient care services described in this note is  35  Minutes.  Dr. Kalman ShanMurali Ramaswamy, M.D., [facility] CountyF.C.C.P Pulmonary and Critical Care Medicine Staff Physician [facility] Pager: [phone], If no answer or between  15:00h - 7:00h: call [phone]  03/10/2014 9:15 AM

## 2014-03-10 NOTE — Progress Notes (Signed)
ANTICOAGULATION CONSULT NOTE  Pharmacy Consult for Heparin Indication: atrial fibrillation  No Known Allergies  Patient Measurements: Height: 6' (182.9 cm) Weight: 205 lb 11 oz (93.3 kg) IBW/kg (Calculated) : 77.6 Heparin Dosing Weight: 91kg  Vital Signs: Temp: 100.1 F (37.8 C) (06/30 1956) Temp src: Oral (06/30 1956) BP: 100/50 mmHg (06/30 2308) Pulse Rate: 75 (06/30 2308)  Labs:  Recent Labs  03/08/14 0430  03/09/14 0443  03/10/14 0615 03/10/14 1325 03/10/14 2235  HGB 11.3*  --  12.3*  --  11.2*  --   --   HCT 33.6*  --  36.3*  --  33.3*  --   --   PLT 61*  --  93*  --  66*  --   --   LABPROT  --   --  18.8*  --   --   --   --   INR  --   --  1.57*  --   --   --   --   HEPARINUNFRC  --   < > 0.26*  < > 0.15* 0.43 0.52  CREATININE 0.85  --  0.82  --  0.77  --   --   < > = values in this interval not displayed.  Estimated Creatinine Clearance: 94.7 ml/min (by C-G formula based on Cr of 0.77).   Assessment: 73 yo male s/p cardiac arrest/hypothermia, with afib, for heparin   Goal of Therapy:  Heparin level 0.3-0.7 units/ml Monitor platelets by anticoagulation protocol: Yes   Plan:  Continue Heparin at current rate  Geannie RisenGreg Abbott, PharmD, BCPS  03/10/2014 11:21 PM

## 2014-03-10 NOTE — Progress Notes (Addendum)
Pt becoming more agitated and breathing over vent, RR 30-35.  Pt was given 50mcg of Fentanyl X2 with no improvement. Family at bedside. Dr Craige CottaSood paged and ordered to restart Propofol.

## 2014-03-10 NOTE — Progress Notes (Signed)
Pt in sinus bradicardia this morning, has had intermittent Afib, rate controlled. Has known history of Afib and is anticoagulated with Heparin infusion. Will continue to monitor closely.

## 2014-03-10 NOTE — Progress Notes (Signed)
eLink Physician-Brief Progress Note Patient Name: Kyle MornJerry Harrell DOB: 02/16/1941 MRN: [id number]  Date of Service  03/10/2014   HPI/Events of Note   Sedation turned off during the day. Pt more agitated and dysynchronous with vent.   eICU Interventions  Will restart diprivan   Intervention Category Major Interventions: Other:  SOOD,VINEET 03/10/2014, 7:01 PM

## 2014-03-10 NOTE — Progress Notes (Signed)
Subjective: Patient is noted demonstrated recurrence of seizure activity. Propofol is currently being weaned this down to 10 mcg per kilogram per minute.  Objective: Current vital signs: BP 115/54  Pulse 68  Temp(Src) 99.7 F (37.6 C) (Oral)  Resp 23  Ht 6' (1.829 m)  Wt 93.3 kg (205 lb 11 oz)  BMI 27.89 kg/m2  SpO2 98%  Neurologic Exam: Intubated and on mechanical ventilation but breathing spontaneously. No response to external stimuli. Left pupil somewhat irregular in shape and showed no clear reaction to light. Extraocular movements were sluggish with oculocephalic maneuvers. Corneal reflexes intact bilaterally. No facial weakness noted. Gag reflex intact bilaterally. Muscle tone was flaccid throughout. Patient had no spontaneous movements and no abnormal posturing.  EEG monitoring at the time of this evaluation did not show any sign of active seizure activity. Severe slowing of cerebral activity was noted diffusely.  Medications: I have reviewed the patient's current medications.  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 Patient is clearly more responsive with reduction and propofol. Seizure activity appears to be controlled at this point with Keppra and Dilantin, in addition to propofol, which is minimal at this point. Prognosis is guarded at this point.  I will plan to discontinue propofol as well as EEG monitoring if there is no recurrence of seizure activity. Recommend a repeat CT scan of the head without contrast.  We will continue to follow this patient closely with you  C.R. Roseanne RenoStewart, MD [facility] [phone]  03/10/2014  9:01 AM

## 2014-03-10 NOTE — Progress Notes (Signed)
NUTRITION FOLLOW-UP  DOCUMENTATION CODES Per approved criteria  -Not Applicable   INTERVENTION: Increase Vital AF 1.2 to 70 ml/hr. Tube feeding regimen will now provide 2016 kcal (97% of needs), 126 grams of protein (100% of needs), and 1362 ml of H2O.  RD to continue to follow nutrition care plan.  NUTRITION DIAGNOSIS: Inadequate oral intake related to inability to eat as evidenced by NPO status. Ongoing.  Goal: Intake to meet >90% of estimated nutrition needs. Met.  Monitor:  weight trends, lab trends, I/O's, vent status/settings, TF initiation/tolerance  ASSESSMENT: PMHx significant for MI, CVA, afib, seizures. Admitted s/p cardiac arrest.   Pt with previous seizure hx and has had witnessed seizure activity in the hospital whenever his sedation is decreased, despite Keppra. Undergoing continuous EEG. Confirmed with RN that propofol is now off.  Intubated since 6/24.  Patient is currently intubated on ventilator support. Per chart, pt does not meet criteria for extubation 2/2 mental status. MV: 12.9 L/min Temp (24hrs), Avg:99 F (37.2 C), Min:97.8 F (36.6 C), Max:99.8 F (37.7 C)  Receiving Vital AF 1.2 at 65 ml/hr.  Potassium WNL Phosphorus  WNL Magnesium WNL CBG's: 136 - 154  Height: Ht Readings from Last 1 Encounters:  03/10/14 6' (1.829 m)    Weight: Wt Readings from Last 1 Encounters:  03/10/14 205 lb 11 oz (93.3 kg)  Admit wt 194 lb  BMI:  Body mass index is 27.89 kg/(m^2). Overweight  Estimated Nutritional Needs: Kcal: 2073 Protein: 96 - 110 g Fluid: approx 1.8 - 2 liters dailiy  Skin: skin tear to R arm  Diet Order:   NPO   Intake/Output Summary (Last 24 hours) at 03/10/14 1026 Last data filed at 03/10/14 0800  Gross per 24 hour  Intake 4002.86 ml  Output   1625 ml  Net 2377.86 ml    Last BM: PTA  Labs:   Recent Labs Lab 03/08/14 0430 03/09/14 0443 03/10/14 0615  NA 138 140 142  K 3.8 4.1 4.4  CL 104 104 106  CO2 24 24 26    BUN 25* 23 21  CREATININE 0.85 0.82 0.77  CALCIUM 8.4 8.3* 8.0*  MG 2.0 2.2 1.9  PHOS 1.9* 3.4 2.6  GLUCOSE 148* 150* 156*    CBG (last 3)   Recent Labs  03/09/14 2325 03/10/14 0412 03/10/14 0743  GLUCAP 154* 136* 141*    Scheduled Meds: . antiseptic oral rinse  15 mL Mouth Rinse QID  . aspirin  81 mg Per Tube Daily  . atorvastatin  80 mg Per NG tube q1800  . chlorhexidine  15 mL Mouth Rinse BID  . insulin aspart  0-15 Units Subcutaneous 6 times per day  . ipratropium-albuterol  3 mL Nebulization Q6H  . levETIRAcetam  1,000 mg Intravenous Q12H  . magnesium sulfate 1 - 4 g bolus IVPB  2 g Intravenous Once  . pantoprazole sodium  40 mg Per Tube Daily  . phenytoin (DILANTIN) IV  100 mg Intravenous 3 times per day    Continuous Infusions: . feeding supplement (VITAL AF 1.2 CAL) 1,000 mL (03/10/14 0607)  . heparin 1,850 Units/hr (03/10/14 0804)  . norepinephrine (LEVOPHED) Adult infusion 8 mcg/min (03/10/14 0400)    Inda Coke MS, RD, LDN Inpatient Registered Dietitian Pager: [phone] After-hours pager: [phone]

## 2014-03-10 NOTE — Progress Notes (Signed)
ANTICOAGULATION CONSULT NOTE - Follow Up Consult  Pharmacy Consult for Heparin Indication: atrial fibrillation  No Known Allergies  Patient Measurements: Height: 6' (182.9 cm) Weight: 205 lb 11 oz (93.3 kg) IBW/kg (Calculated) : 77.6 Heparin Dosing Weight: 91kg  Vital Signs: Temp: 99.5 F (37.5 C) (06/30 1200) Temp src: Core (Comment) (06/30 1200) BP: 103/65 mmHg (06/30 1300) Pulse Rate: 61 (06/30 1300)  Labs:  Recent Labs  03/08/14 0430  03/09/14 0443  03/09/14 1935 03/10/14 0615 03/10/14 1325  HGB 11.3*  --  12.3*  --   --  11.2*  --   HCT 33.6*  --  36.3*  --   --  33.3*  --   PLT 61*  --  93*  --   --  66*  --   LABPROT  --   --  18.8*  --   --   --   --   INR  --   --  1.57*  --   --   --   --   HEPARINUNFRC  --   < > 0.26*  < > <0.10* 0.15* 0.43  CREATININE 0.85  --  0.82  --   --  0.77  --   < > = values in this interval not displayed.  Estimated Creatinine Clearance: 94.7 ml/min (by C-G formula based on Cr of 0.77).   Medications:  Heparin @ 1850 units/hr  Assessment: 73yom s/p cardiac arrest/hypothermia continues on heparin for afib. Heparin level is finally therapeutic. Hgb/Hct are stable. Platelets starting to drop again down to 66 (have been as low as 49 this admission) - will watch closely. No bleeding reported.  Goal of Therapy:  Heparin level 0.3-0.7 units/ml Monitor platelets by anticoagulation protocol: Yes   Plan:  1) Continue heparin at 1850 units/hr 2) Check 8 hour heparin level to confirm  Fredrik RiggerMarkle, Jennifer Sue 03/10/2014,2:07 PM

## 2014-03-10 NOTE — Progress Notes (Signed)
SUBJECTIVE:  73 y/o male with previous CVA admitted witnesses OOH arrest requiring CPR and cooling. 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 His anatomy is not favorable for PCI. He will need consideration for CABG if he recovers neurological function. LVEF is 25% by LV gram. Echo with LVEF=30-35%.   Remains Intubated. Sedated. Nonresponsive. Continues to be monitored with EEG.  Propofol decreased.  Repeat brain CT 6/27: IMPRESSION:  1. No evidence of anoxic brain injury.  2. Mildly motion degraded exam.  3. Remote posterior left MCA distribution cortically based infarct.  4. Sinus disease.    VT arrest on 6/28.  Shocked with 150J and quickly returned to his baseline AF.      Tele: sinus brady, PVCs  BP 112/54  Pulse 58  Temp(Src) 99.7 F (37.6 C) (Oral)  Resp 26  Ht 6' (1.829 m)  Wt 205 lb 11 oz (93.3 kg)  BMI 27.89 kg/m2  SpO2 98%  Intake/Output Summary (Last 24 hours) at 03/10/14 0936 Last data filed at 03/10/14 0800  Gross per 24 hour  Intake 4282.76 ml  Output   1725 ml  Net 2557.76 ml    PHYSICAL EXAM General: Intubated. Does not respond to voice or pain. Involuntary eyelid twitching Neck: No JVD. No masses noted.  Lungs: Mechanical breath sounds bilaterally.  Heart: Irregular with no murmurs noted.  Abdomen: Bowel sounds are present. Soft, non-tender.  Extremities: No lower extremity edema. 2+ DP pulses bilaterally  LABS: Basic Metabolic Panel:  Recent Labs  16/06/9605/29/15 0443 03/10/14 0615  NA 140 142  K 4.1 4.4  CL 104 106  CO2 24 26  GLUCOSE 150* 156*  BUN 23 21  CREATININE 0.82 0.77  CALCIUM 8.3* 8.0*  MG 2.2 1.9  PHOS 3.4 2.6   CBC:  Recent Labs  03/09/14 0443 03/10/14 0615  WBC 8.2 4.7  NEUTROABS 6.1 3.6  HGB 12.3* 11.2*  HCT 36.3* 33.3*  MCV 96.0 96.8  PLT 93* 66*   Cardiac Enzymes: No results found for this basename: CKTOTAL, CKMB, CKMBINDEX, TROPONINI,  in the last 72 hours Current Meds: . antiseptic oral rinse   15 mL Mouth Rinse QID  . aspirin  81 mg Per Tube Daily  . atorvastatin  80 mg Per NG tube q1800  . chlorhexidine  15 mL Mouth Rinse BID  . insulin aspart  0-15 Units Subcutaneous 6 times per day  . ipratropium-albuterol  3 mL Nebulization Q6H  . levETIRAcetam  1,000 mg Intravenous Q12H  . magnesium sulfate 1 - 4 g bolus IVPB  2 g Intravenous Once  . pantoprazole sodium  40 mg Per Tube Daily  . phenytoin (DILANTIN) IV  100 mg Intravenous 3 times per day   Cardiac cath 03/05/14:  Left mainstem: There is diffuse calcification with 40% ostial left main disease. The left main tapers distally to 30%.  Left anterior descending (LAD): The LAD is a large vessel that gives rise to a moderately large diagonal branch. In the proximal LAD there is an aneurysmal segment followed immediately by a severe bifurcation stenosis 1:1:1 in the LAD and first diagonal up to 95%.  There is a moderately large ramus branch without significant disease.  Left circumflex (LCx): The LCx has mild ostial disease but otherwise has no significant stenosis.  Right coronary artery (RCA): Dominant vessel with 70% proximal stenosis. The crux of the RCA is severely diseased up to 90%. The Distal RCA is occluded with right to right and left to  right collaterals.  Left ventriculography: Left ventricular systolic function is abnormal, LV size is increased with extensive inferior akinesis and global hypokinesis. LVEF is estimated at 25%, there is mild to moderate mitral regurgitation   Echo 03/05/14: Left ventricle: The cavity size was moderately dilated. Systolic function was moderately to severely reduced. The estimated ejection fraction was in the range of 30% to 35%. There is akinesis of the entireinferior myocardium. There is akinesis of the basal-midinferolateral myocardium. - Aortic valve: Moderate thickening and calcification, consistent with sclerosis. There was trivial regurgitation. - Ascending aorta: The ascending aorta  was mildly dilated. - Left atrium: The atrium was mildly dilated.  ASSESSMENT AND PLAN:   1. Ventricular fibrillation arrest, out of hospital: Witnessed arrest at home. Likely primary arrythmia event with ischemic cardiomyopathy. Bystander CPR. Admitted by CCM. Pt found to have high grade CAD with complex disease involving the LAD/Diagonal bifurcation and severe disease in the RCA but these lesions were not acute. No PCI performed. S/p Cooling protocol per PCCM. Recurrent VT on 6/28. Shocked. Stopped amio due to bradycardia with seizure meds. K 4.4 mg 1.9. Mg being supplemented today.  2. CAD: Severe double vessel disease by cath 03/05/14. His anatomy is not favorable for PCI. He will need consideration for CABG if he recovers neurological function.On ASA. b-blocker stopped due to severe bradycardia earlier. LVEF is 25% by LV gram. Echo with LVEF=30-35%.     3. Ischemic cardiomyopathy: As above. Will follow for now. No signs of ADHF. Continue ACe/b-blocker when BP allows.  Weaning Levophed and propofol.   4. Atrial fib/AFL: rate controlled. No Amio despite VT due to bradycardia. Continue heparin  5. Possible anoxic brain injury:  Lightening sedation.  Corneal and gag reflex present.  Neuro will continue to monitor.    6. H/o CVA  The patient is critically ill with multiple organ systems failure and requires high complexity decision making for assessment and support, frequent evaluation and titration of therapies, application of advanced monitoring technologies and extensive interpretation of multiple databases.       Corky CraftsVARANASI,JAYADEEP S. MD  6/30/20159:36 AM

## 2014-03-10 NOTE — Progress Notes (Signed)
ANTICOAGULATION CONSULT NOTE  Pharmacy Consult for Heparin Indication: atrial fibrillation  No Known Allergies  Patient Measurements: Height: 6' (182.9 cm) Weight: 205 lb 11 oz (93.3 kg) IBW/kg (Calculated) : 77.6 Heparin Dosing Weight: 91kg  Vital Signs: Temp: 99.8 F (37.7 C) (06/30 0335) Temp src: Oral (06/30 0335) BP: 117/59 mmHg (06/30 0600) Pulse Rate: 69 (06/30 0600)  Labs:  Recent Labs  03/08/14 0430  03/09/14 0443 03/09/14 1300 03/09/14 1935 03/10/14 0615  HGB 11.3*  --  12.3*  --   --  11.2*  HCT 33.6*  --  36.3*  --   --  33.3*  PLT 61*  --  93*  --   --  66*  LABPROT  --   --  18.8*  --   --   --   INR  --   --  1.57*  --   --   --   HEPARINUNFRC  --   < > 0.26* 0.22* <0.10* 0.15*  CREATININE 0.85  --  0.82  --   --  0.77  < > = values in this interval not displayed.  Estimated Creatinine Clearance: 94.7 ml/min (by C-G formula based on Cr of 0.77).   Assessment: 73 yo male s/p cardiac arrest/hypothermia, with afib, for heparin   Goal of Therapy:  Heparin level 0.3-0.7 units/ml Monitor platelets by anticoagulation protocol: Yes   Plan:  Increase Heparin 1850 units/hr Check heparin level in 8 hours.  Geannie RisenGreg Abbott, PharmD, BCPS  03/10/2014 6:59 AM

## 2014-03-11 ENCOUNTER — Inpatient Hospital Stay (HOSPITAL_COMMUNITY): Payer: Medicare Other

## 2014-03-11 ENCOUNTER — Telehealth: Payer: Self-pay | Admitting: Neurology

## 2014-03-11 LAB — GLUCOSE, CAPILLARY
GLUCOSE-CAPILLARY: 153 mg/dL — AB (ref 70–99)
GLUCOSE-CAPILLARY: 182 mg/dL — AB (ref 70–99)
GLUCOSE-CAPILLARY: 127 mg/dL — AB (ref 70–99)
GLUCOSE-CAPILLARY: 145 mg/dL — AB (ref 70–99)
Glucose-Capillary: 156 mg/dL — ABNORMAL HIGH (ref 70–99)

## 2014-03-11 LAB — PROCALCITONIN: Procalcitonin: 0.1 ng/mL

## 2014-03-11 LAB — HEPARIN LEVEL (UNFRACTIONATED): HEPARIN UNFRACTIONATED: 0.54 [IU]/mL (ref 0.30–0.70)

## 2014-03-11 LAB — TRIGLYCERIDES: TRIGLYCERIDES: 86 mg/dL (ref <150)

## 2014-03-11 MED ORDER — PIPERACILLIN-TAZOBACTAM 3.375 G IVPB
3.3750 g | Freq: Three times a day (TID) | INTRAVENOUS | Status: DC
  Administered 2014-03-11 – 2014-03-17 (×18): 3.375 g via INTRAVENOUS
  Filled 2014-03-11 (×20): qty 50

## 2014-03-11 NOTE — Telephone Encounter (Signed)
Pt's son called to report pt had a heart attack last week and it has caused major damage to his brain, the providers are not giving much hope towards recovery. Son wanted to make you aware of this and asked that you call. CB# [phone]. [facility]

## 2014-03-11 NOTE — Progress Notes (Signed)
Unhooked LTM per Dr Thad Rangereynolds.

## 2014-03-11 NOTE — Progress Notes (Signed)
SUBJECTIVE:  73 y/o male with previous CVA admitted witnesses OOH arrest requiring CPR and cooling. 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 His anatomy is not favorable for PCI. He will need consideration for CABG if he recovers neurological function. LVEF is 25% by LV gram. Echo with LVEF=30-35%.   Remains Intubated. Sedated. Nonresponsive. Continues to be monitored with EEG.  Propofol off.  Now off of Levophed.  He was biting the ET tube with sedation decreased.     VT arrest on 6/28.  Shocked with 150J and quickly returned to his baseline AF.      Tele: sinus brady, PVCs  BP 126/47  Pulse 79  Temp(Src) 99.8 F (37.7 C) (Oral)  Resp 26  Ht 6' (1.829 m)  Wt 204 lb 9.4 oz (92.8 kg)  BMI 27.74 kg/m2  SpO2 96%  Intake/Output Summary (Last 24 hours) at 03/11/14 0950 Last data filed at 03/11/14 0900  Gross per 24 hour  Intake 3181.41 ml  Output   1145 ml  Net 2036.41 ml    PHYSICAL EXAM General: Intubated. Does not respond to voice or pain. Involuntary eyelid twitching Neck: No JVD. No masses noted.  Lungs: Mechanical breath sounds bilaterally.  Heart: Irregular with no murmurs noted.  Abdomen: Bowel sounds are present. Soft, non-tender.  Extremities: No lower extremity edema. 2+ DP pulses bilaterally  LABS: Basic Metabolic Panel:  Recent Labs  16/06/9605/29/15 0443 03/10/14 0615  NA 140 142  K 4.1 4.4  CL 104 106  CO2 24 26  GLUCOSE 150* 156*  BUN 23 21  CREATININE 0.82 0.77  CALCIUM 8.3* 8.0*  MG 2.2 1.9  PHOS 3.4 2.6   CBC:  Recent Labs  03/09/14 0443 03/10/14 0615  WBC 8.2 4.7  NEUTROABS 6.1 3.6  HGB 12.3* 11.2*  HCT 36.3* 33.3*  MCV 96.0 96.8  PLT 93* 66*   Cardiac Enzymes: No results found for this basename: CKTOTAL, CKMB, CKMBINDEX, TROPONINI,  in the last 72 hours Current Meds: . antiseptic oral rinse  15 mL Mouth Rinse QID  . aspirin  81 mg Per Tube Daily  . atorvastatin  80 mg Per NG tube q1800  . chlorhexidine  15 mL Mouth  Rinse BID  . insulin aspart  0-15 Units Subcutaneous 6 times per day  . ipratropium-albuterol  3 mL Nebulization Q6H  . levETIRAcetam  1,000 mg Intravenous Q12H  . pantoprazole sodium  40 mg Per Tube Daily  . phenytoin (DILANTIN) IV  100 mg Intravenous 3 times per day  . piperacillin-tazobactam (ZOSYN)  IV  3.375 g Intravenous 3 times per day   Cardiac cath 03/05/14:  Left mainstem: There is diffuse calcification with 40% ostial left main disease. The left main tapers distally to 30%.  Left anterior descending (LAD): The LAD is a large vessel that gives rise to a moderately large diagonal branch. In the proximal LAD there is an aneurysmal segment followed immediately by a severe bifurcation stenosis 1:1:1 in the LAD and first diagonal up to 95%.  There is a moderately large ramus branch without significant disease.  Left circumflex (LCx): The LCx has mild ostial disease but otherwise has no significant stenosis.  Right coronary artery (RCA): Dominant vessel with 70% proximal stenosis. The crux of the RCA is severely diseased up to 90%. The Distal RCA is occluded with right to right and left to right collaterals.  Left ventriculography: Left ventricular systolic function is abnormal, LV size is increased with extensive inferior akinesis  and global hypokinesis. LVEF is estimated at 25%, there is mild to moderate mitral regurgitation   Echo 03/05/14: Left ventricle: The cavity size was moderately dilated. Systolic function was moderately to severely reduced. The estimated ejection fraction was in the range of 30% to 35%. There is akinesis of the entireinferior myocardium. There is akinesis of the basal-midinferolateral myocardium. - Aortic valve: Moderate thickening and calcification, consistent with sclerosis. There was trivial regurgitation. - Ascending aorta: The ascending aorta was mildly dilated. - Left atrium: The atrium was mildly dilated.  ASSESSMENT AND PLAN:   1. Ventricular  fibrillation arrest, out of hospital: Witnessed arrest at home. Likely primary arrythmia event with ischemic cardiomyopathy. Bystander CPR. Admitted by CCM. Pt found to have high grade CAD with complex disease involving the LAD/Diagonal bifurcation and severe disease in the RCA but these lesions were not acute. No PCI performed. S/p Cooling protocol per PCCM. Recurrent VT on 6/28. Shocked. Stopped amio due to bradycardia with seizure meds. K 4.4 mg 1.9 on 6/30. Mg  supplemented yesterday.  2. CAD: Severe double vessel disease by cath 03/05/14. His anatomy is not favorable for PCI. He will need consideration for CABG if he recovers neurological function.On ASA. b-blocker stopped due to severe bradycardia earlier. LVEF is 25% by LV gram. Echo with LVEF=30-35%.     3. Ischemic cardiomyopathy: As above. Will follow for now. No signs of ADHF. Continue ACe/b-blocker when BP allows.  Off of Levophed and propofol.   4. Atrial fib/AFL: rate controlled. No Amio despite VT due to bradycardia. Continue heparin  5. Possible anoxic brain injury:  Lightening sedation.  Corneal and gag reflex present.  Neuro will continue to monitor.    6. H/o CVA  The patient is critically ill with multiple organ systems failure and requires high complexity decision making for assessment and support, frequent evaluation and titration of therapies, application of advanced monitoring technologies and extensive interpretation of multiple databases.       Corky CraftsVARANASI,Jerrilynn Mikowski S. MD  7/1/20159:50 AM

## 2014-03-11 NOTE — Progress Notes (Signed)
PULMONARY / CRITICAL CARE MEDICINE   Name: Kyle [name] MRN: [id number] DOB: 07/26/1941    ADMISSION DATE:  02/28/2014 CONSULTATION DATE:  02/20/2014  REFERRING MD :  EDP PRIMARY SERVICE: PCCM  CHIEF COMPLAINT:  Cardiac arrest  BRIEF PATIENT DESCRIPTION: 73 year old male with PMH MI, CVA, A-fib, and seizures suffered witnessed cardiac arrest 6/24.  Initial rhythm VF. ROSC achieved with defibrillation, CPR and amiodarone. In ED found to have ECG changes. PCCM asked to see for admission.   SIGNIFICANT EVENTS / STUDIES:  6/24 adm after VF arrest. Hypothermia initiated 6/24 CT head: No acute intracranial process. Remote CVA 6/24 CTA chest - No pulmonary emboli. 10 mm nodule in RUL. Multiple bilateral anterior rib fractures. 6/24 LHC: severe 2 vessel obstructive CAD - not amenable to PCI. Severe LV dysfunction (LVEF 25%). Elevated LV pressures. Rec med rx. Consider CABG in future 6/25 TTE:  LVEF 30-35%, mod dil LV, akinesis of entire inf myocardium, basal-mid inferolateral myocardium, sclerosis of aortic valve, mod dil aorta and mod dil LA  6/27: rewarmed. PEr elink report: myoclonus and therefore versed started. Per Nursing: opened eyes, has gag but not following commands -? On sedation gtt. Formal WUA in progress. Also, amio started for a flutter/a tach  03/08/14: Intermittent seizures controlled by diprivan. Not responsive on wua. EEG ordered.  CT head - no anoxia, remote posterior LMCA stroke. Off amio gtt; still in A Fib   03/09/14: V T arrestyesterday.  EEG with diprivian in progress. Also on diprivan and keppra and per neuro no seizures. No family at bedside    03/10/14: On keppra and dilantin and very low dose diprivan: neuro feels he is better with no seizures and regain in corneal, gag and pupil, RASS -3 LINES / TUBES: R - port-a-cath (chronic) [facility] CVL 6/24 >>> ETT 6/24 >>>  CULTURES: Urine 6/24 >>> NEG MRSA PCR 6/24 >>> NEG  ANTIBIOTICS: Anti-infectives   None       SUBJECTIVE:    03/11/14: Got dysnch with vent end of day yesterday and diprivan back on. Per son Trey PaulaJeff: he is agitated, bites tube, opens eyes to voice but does not follow commands or is purposeful./ Currently 30 min off diprivan and RASS -4. Having low grade fever. OFf levophed   VITAL SIGNS: Temp:  [99.1 F (37.3 C)-100.1 F (37.8 C)] 99.8 F (37.7 C) (07/01 0700) Pulse Rate:  [37-100] 81 (07/01 0825) Resp:  [17-36] 25 (07/01 0825) BP: (88-180)/(43-93) 128/59 mmHg (07/01 0825) SpO2:  [92 %-99 %] 96 % (07/01 0825) FiO2 (%):  [30 %] 30 % (07/01 0400) Weight:  [92.8 kg (204 lb 9.4 oz)] 92.8 kg (204 lb 9.4 oz) (07/01 0500)  HEMODYNAMICS:    VENTILATOR SETTINGS: Vent Mode:  [-] PRVC FiO2 (%):  [30 %] 30 % Set Rate:  [14 bmp] 14 bmp Vt Set:  [550 mL] 550 mL PEEP:  [5 cmH20] 5 cmH20 Plateau Pressure:  [17 cmH20-20 cmH20] 17 cmH20  INTAKE / OUTPUT: Intake/Output     06/30 0701 - 07/01 0700 07/01 0701 - 07/02 0700   I.V. (mL/kg) 1144.2 (12.3)    NG/GT 1711.3    IV Piggyback 270    Total Intake(mL/kg) 3125.5 (33.7)    Urine (mL/kg/hr) 1170 (0.5)    Total Output 1170     Net +1955.5           PHYSICAL EXAMINATION: General: Looks critically ill Neuro: On keppra and dilantin and off diprivanx  30 min:  Corneal +, gag  and pupil +, RASS -3/-4 HEENT:  [place]/AT, pupils asymmetric with R cataract Cardiovascular: IRIR, no M noted Lungs:  Clear anteriorly Abdomen:  Soft, non-distended. BS + Ext. Mild peripheral edema.  LABS:  PULMONARY  Recent Labs Lab 02/13/2014 1950  03/05/14 0154 03/05/14 0444 03/05/14 0636 03/05/14 0922 03/06/14 0500  PHART 7.218*  --  7.425  --   --  7.419 7.410  PCO2ART 59.5*  --  31.2*  --   --  33.8* 31.6*  PO2ART 449.0*  --  191.0*  --   --  188.0* 113.0*  HCO3 24.2*  --  21.4  --   --  22.8 20.4  TCO2 26  < > 23 21 22 24  21.5  O2SAT 100.0  --  100.0  --   --  100.0 98.8  < > = values in this interval not displayed.  CBC  Recent Labs Lab  03/08/14 0430 03/09/14 0443 03/10/14 0615  HGB 11.3* 12.3* 11.2*  HCT 33.6* 36.3* 33.3*  WBC 4.8 8.2 4.7  PLT 61* 93* 66*    COAGULATION  Recent Labs Lab 02/22/2014 1949 03/05/14 0055 03/05/14 1430 03/09/14 0443  INR 1.83* 1.89* 2.01* 1.57*    CARDIAC    Recent Labs Lab 02/14/2014 1949  TROPONINI <0.30    Recent Labs Lab 03/05/14 0307  PROBNP 1114.0*     CHEMISTRY  Recent Labs Lab 03/06/14 0400 03/07/14 0437 03/07/14 0924 03/08/14 0430 03/09/14 0443 03/10/14 0615  NA 141 139  --  138 140 142  K 3.9 4.2  --  3.8 4.1 4.4  CL 105 106  --  104 104 106  CO2 20 21  --  24 24 26   GLUCOSE 154* 151*  --  148* 150* 156*  BUN 14 19  --  25* 23 21  CREATININE 0.74 0.98  --  0.85 0.82 0.77  CALCIUM 8.5 8.2*  --  8.4 8.3* 8.0*  MG 1.6  --  2.1 2.0 2.2 1.9  PHOS 3.5  --  1.8* 1.9* 3.4 2.6   Estimated Creatinine Clearance: 87.6 ml/min (by C-G formula based on Cr of 0.77).   LIVER  Recent Labs Lab 02/22/2014 1949 03/05/14 0055 03/05/14 1430 03/09/14 0443  AST 70*  --   --   --   ALT 59*  --   --   --   ALKPHOS 69  --   --   --   BILITOT 0.6  --   --   --   PROT 5.4*  --   --   --   ALBUMIN 3.4*  --   --   --   INR 1.83* 1.89* 2.01* 1.57*     INFECTIOUS  Recent Labs Lab 03/05/14 2000 03/06/14 0235 03/07/14 0807  LATICACIDVEN 3.0* 2.7* 1.1     ENDOCRINE CBG (last 3)   Recent Labs  03/10/14 2318 03/11/14 0356 03/11/14 0743  GLUCAP 130* 153* 156*         IMAGING x48h  Dg Chest Port 1 View  03/11/2014   CLINICAL DATA:  Endotracheal tube placement.  EXAM: PORTABLE CHEST - 1 VIEW  COMPARISON:  Multiple recent previous exams.  FINDINGS: 01/2027 hrs. Endotracheal tube tip is 7.6 cm above the base of the carina. The patient is rotated to the right. The cardio pericardial silhouette is enlarged. The bibasilar collapse/ consolidation persists without interval change. Left costophrenic angle is not included on the film. The NG tube passes into  the stomach although the distal  tip position is not included on the film. Left IJ central line tip is positioned over the mid SVC. The right Port-A-Cath remains in place with tip overlying the junction of the SVC and RA. Telemetry leads overlie the chest.  IMPRESSION: No substantial interval change in cardiopulmonary exam. There is bibasilar collapse/ consolidation.   Electronically Signed   By: Kennith Center M.D.   On: 03/11/2014 07:27   Dg Chest Port 1 View  03/10/2014   CLINICAL DATA:  Endotracheal tube position  EXAM: PORTABLE CHEST - 1 VIEW  COMPARISON:  03/09/2014  FINDINGS: Endotracheal tube approximately 4 cm above the carina. Left jugular catheter tip in the SVC. Port-A-Cath tip at the cavoatrial junction unchanged. NG tube enters the stomach.  Progression of bibasilar opacities consistent with atelectasis and effusion. Question fluid overload with pulmonary vascular congestion noted.  IMPRESSION: Worsening aeration in the lung bases consistent with increasing bibasilar atelectasis and effusions. Question fluid overload   Electronically Signed   By: Marlan Palau M.D.   On: 03/10/2014 07:15      ASSESSMENT / PLAN:  PULMONARY A: Acute respiratory failure 2nd to cardiac arrest H/o COPD Lung nodule on CT   - does not meet sbt/extubation criteria due to mental status.    P:   Cont full vent support - settings reviewed and/or adjusted Cont vent bundle Daily SBT if/when meets criteria  CARDIOVASCULAR A:  VF arrest  Hypertension Atrial fib Critical CAD   -.  Had VT 03/08/14 and shocked.   Off amio 6/30 and levophed 7/11. But still on  heparin IV gtt P:   MAP goal to 65 mmHg Cards following Consider CABG if neuro recovery good  RENAL A:   Labs pending.   P:   Replete low phos Monitor BMET ,   Monitor I/Os Correct electrolytes as indicated  GASTROINTESTINAL A:   No issues P:   SUP: IV PPI TFs 6/26  HEMATOLOGIC A:   Chronic warfarin therapy for  CAF Thrombocytopenia of uncertain etiology P:  DVT px: SCDs Monitor CBC intermittently Transfuse per usual ICU guidelines Holding warfarin but on IV heparin gtt per cards  INFECTIOUS A:  Running low grade temp  P:   Resop culture and urine and blood culture and start zosyn Check PCT  ENDOCRINE A:    Hyperglycemia without prior DM P:   Cont SSI  NEUROLOGIC A:   Acute encephalopathy, post anoxic H/o seizures, CVA   - having seizures 6/27 and 03/08/14  - 03/11/14: seizures controlled per report with dilantin and keppra. Currently RASS -/3/-4 off diprivan x 45 min but there ar reports of agitation, biting tube, opening eues  P:   Hold Diprivan  To extent possible PRN fentanyl only Daily WUA   03/08/14: Ramond Dial updated at bedside. T 03/09/14: No family at bedside 03/10/14: Fuller Plan updated over phone. Plan to monitor over next few to several days for wakefulness before deciding further goals 03/11/14: Son JEff updated. He says patient has purpose of living very actively and vibrant. He fears that patient would end SNF, Vent. LTAC; outcome he does not like. EXplained neuro prognosis is uncertain but better left to neuro; I have contacted Dr Thad Ranger   The patient is critically ill with multiple organ systems failure and requires high complexity decision making for assessment and support, frequent evaluation and titration of therapies, application of advanced monitoring technologies and extensive interpretation of multiple databases.   Critical Care Time devoted to patient care services described  in this note is  35  Minutes.  Dr. Kalman Shan, M.D., [facility].C.P Pulmonary and Critical Care Medicine Staff Physician [facility] Pager: [phone], If no answer or between  15:00h - 7:00h: call [phone]  03/11/2014 8:55 AM

## 2014-03-11 NOTE — Telephone Encounter (Signed)
Returned call to patient's son and discussed that in-patient neurology team is following patient. Unfortunately, I am unable to give my opinion on prognosis but acknowledged their concerns and reassured him that the in-patient services will keep them informed of any updates.  Myranda Pavone K. Allena KatzPatel, DO

## 2014-03-11 NOTE — Progress Notes (Signed)
ANTICOAGULATION CONSULT NOTE - Follow Up Consult  Pharmacy Consult for Heparin Indication: atrial fibrillation  No Known Allergies  Patient Measurements: Height: 6' (182.9 cm) Weight: 204 lb 9.4 oz (92.8 kg) IBW/kg (Calculated) : 77.6 Heparin Dosing Weight: 91kg  Vital Signs: Temp: 99.8 F (37.7 C) (07/01 0700) Temp src: Oral (07/01 0700) BP: 126/47 mmHg (07/01 0900) Pulse Rate: 79 (07/01 0900)  Labs:  Recent Labs  03/09/14 0443  03/10/14 0615 03/10/14 1325 03/10/14 2235 03/11/14 0600  HGB 12.3*  --  11.2*  --   --   --   HCT 36.3*  --  33.3*  --   --   --   PLT 93*  --  66*  --   --   --   LABPROT 18.8*  --   --   --   --   --   INR 1.57*  --   --   --   --   --   HEPARINUNFRC 0.26*  < > 0.15* 0.43 0.52 0.54  CREATININE 0.82  --  0.77  --   --   --   < > = values in this interval not displayed.  Estimated Creatinine Clearance: 87.6 ml/min (by C-G formula based on Cr of 0.77).   Medications:  Heparin @ 1850 units/hr  Assessment: 73yom s/p cardiac arrest/hypothermia continues on heparin for afib. Heparin level is therapeutic. Hgb/Hct are pending this morning. No bleeding reported.  Tmax 100.1, wbc normal, concern for VAP, zosyn to be started this am.  Goal of Therapy:  Heparin level 0.3-0.7 units/ml Monitor platelets by anticoagulation protocol: Yes   Plan:  1) Continue heparin at 1850 units/hr 2) Daily HL/CBC 3) Zosyn 3.375g IV q8  Sheppard CoilFrank Wilson PharmD., BCPS Clinical Pharmacist Pager [phone] 03/11/2014 9:39 AM

## 2014-03-11 NOTE — Progress Notes (Addendum)
Subjective: Patient now back on Propofol.  Was off sedation for most of the day.  Was noted clinically to open his eyes to verbal stimuli and was noted to move his extremities as well but did not regain consciousness.    Objective: Current vital signs: BP 143/72  Pulse 69  Temp(Src) 98.7 F (37.1 C) (Oral)  Resp 23  Ht 6' (1.829 m)  Wt 92.8 kg (204 lb 9.4 oz)  BMI 27.74 kg/m2  SpO2 97% Vital signs in last 24 hours: Temp:  [98.7 F (37.1 C)-100.1 F (37.8 C)] 98.7 F (37.1 C) (07/01 1631) Pulse Rate:  [37-114] 69 (07/01 1700) Resp:  [17-36] 23 (07/01 1700) BP: (88-180)/(43-93) 143/72 mmHg (07/01 1700) SpO2:  [92 %-99 %] 97 % (07/01 1700) FiO2 (%):  [30 %] 30 % (07/01 1657) Weight:  [92.8 kg (204 lb 9.4 oz)] 92.8 kg (204 lb 9.4 oz) (07/01 0500)  Intake/Output from previous day: 06/30 0701 - 07/01 0700 In: 3249 [I.V.:1197.7; NG/GT:1781.3; IV Piggyback:270] Out: 1170 [Urine:1170] Intake/Output this shift: Total I/O In: 1316.4 [I.V.:356.4; NG/GT:800; IV Piggyback:160] Out: 500 [Urine:500] Nutritional status:    Lungs: CTA Neurologic Exam: Mental Status: Patient does not respond to verbal stimuli.  Does not respond to deep sternal rub.  Does not follow commands.  No verbalizations noted.  Cranial Nerves: II: patient does not respond confrontation bilaterally, pupils right clouded and unreactive mm, left 3 mm,and reactive bilaterally III,IV,VI: doll's response present bilaterally.  V,VII: corneal reflex reduced on the right  VIII: patient does not respond to verbal stimuli IX,X: gag reflex reduced, XI: trapezius strength unable to test bilaterally XII: tongue strength unable to test Motor: Extremities flaccid throughout.  No spontaneous movement noted.  No purposeful movements noted. Sensory: Does not respond to noxious stimuli in any extremity. Deep Tendon Reflexes:  1+ with absent AJ's bilaterally Plantars: upgoing bilaterally Cerebellar: Unable to perform    Lab  Results: Basic Metabolic Panel:  Recent Labs Lab 03/06/14 0400 03/07/14 0437 03/07/14 0924 03/08/14 0430 03/09/14 0443 03/10/14 0615  NA 141 139  --  138 140 142  K 3.9 4.2  --  3.8 4.1 4.4  CL 105 106  --  104 104 106  CO2 20 21  --  24 24 26   GLUCOSE 154* 151*  --  148* 150* 156*  BUN 14 19  --  25* 23 21  CREATININE 0.74 0.98  --  0.85 0.82 0.77  CALCIUM 8.5 8.2*  --  8.4 8.3* 8.0*  MG 1.6  --  2.1 2.0 2.2 1.9  PHOS 3.5  --  1.8* 1.9* 3.4 2.6    Liver Function Tests:  Recent Labs Lab 07/12/2014 1949  AST 70*  ALT 59*  ALKPHOS 69  BILITOT 0.6  PROT 5.4*  ALBUMIN 3.4*   No results found for this basename: LIPASE, AMYLASE,  in the last 168 hours No results found for this basename: AMMONIA,  in the last 168 hours  CBC:  Recent Labs Lab 03/06/14 0400 03/07/14 0437 03/08/14 0430 03/09/14 0443 03/10/14 0615  WBC 6.9 6.3 4.8 8.2 4.7  NEUTROABS  --   --  3.8 6.1 3.6  HGB 14.1 12.7* 11.3* 12.3* 11.2*  HCT 40.2 37.5* 33.6* 36.3* 33.3*  MCV 91.2 94.7 94.9 96.0 96.8  PLT 62* 62* 61* 93* 66*    Cardiac Enzymes:  Recent Labs Lab 07/12/2014 1949  TROPONINI <0.30    Lipid Panel:  Recent Labs Lab 03/07/14 1000 03/10/14 0615 03/10/14 2320  TRIG 69 60 86    CBG:  Recent Labs Lab 03/10/14 1945 03/10/14 2318 03/11/14 0356 03/11/14 0743 03/11/14 1125  GLUCAP 154* 130* 153* 156* 182*    Microbiology: Results for orders placed during the hospital encounter of 03/08/2014  URINE CULTURE     Status: None   Collection Time    02/26/2014  7:55 PM      Result Value Ref Range Status   Specimen Description URINE, CATHETERIZED   Final   Special Requests NONE   Final   Culture  Setup Time     Final   Value: 03/05/2014 01:10     Performed at [facility] Count     Final   Value: NO GROWTH     Performed at [facility]   Culture     Final   Value: NO GROWTH     Performed at [facility]   Report Status 03/05/2014 FINAL    Final  MRSA PCR SCREENING     Status: None   Collection Time    03/05/14 12:31 AM      Result Value Ref Range Status   MRSA by PCR NEGATIVE  NEGATIVE Final   Comment:            The GeneXpert MRSA Assay (FDA     approved for NASAL specimens     only), is one component of a     comprehensive MRSA colonization     surveillance program. It is not     intended to diagnose MRSA     infection nor to guide or     monitor treatment for     MRSA infections.    Coagulation Studies:  Recent Labs  03/09/14 0443  LABPROT 18.8*  INR 1.57*    Imaging: Dg Chest Port 1 View  03/11/2014   CLINICAL DATA:  Endotracheal tube placement.  EXAM: PORTABLE CHEST - 1 VIEW  COMPARISON:  Multiple recent previous exams.  FINDINGS: 01/2027 hrs. Endotracheal tube tip is 7.6 cm above the base of the carina. The patient is rotated to the right. The cardio pericardial silhouette is enlarged. The bibasilar collapse/ consolidation persists without interval change. Left costophrenic angle is not included on the film. The NG tube passes into the stomach although the distal tip position is not included on the film. Left IJ central line tip is positioned over the mid SVC. The right Port-A-Cath remains in place with tip overlying the junction of the SVC and RA. Telemetry leads overlie the chest.  IMPRESSION: No substantial interval change in cardiopulmonary exam. There is bibasilar collapse/ consolidation.   Electronically Signed   By: Kennith Center M.D.   On: 03/11/2014 07:27   Dg Chest Port 1 View  03/10/2014   CLINICAL DATA:  Endotracheal tube position  EXAM: PORTABLE CHEST - 1 VIEW  COMPARISON:  03/09/2014  FINDINGS: Endotracheal tube approximately 4 cm above the carina. Left jugular catheter tip in the SVC. Port-A-Cath tip at the cavoatrial junction unchanged. NG tube enters the stomach.  Progression of bibasilar opacities consistent with atelectasis and effusion. Question fluid overload with pulmonary vascular congestion  noted.  IMPRESSION: Worsening aeration in the lung bases consistent with increasing bibasilar atelectasis and effusions. Question fluid overload   Electronically Signed   By: Marlan Palau M.D.   On: 03/10/2014 07:15    Medications:  I have reviewed the patient's current medications. Scheduled: . antiseptic oral rinse  15 mL Mouth Rinse  QID  . aspirin  81 mg Per Tube Daily  . atorvastatin  80 mg Per NG tube q1800  . chlorhexidine  15 mL Mouth Rinse BID  . insulin aspart  0-15 Units Subcutaneous 6 times per day  . ipratropium-albuterol  3 mL Nebulization Q6H  . levETIRAcetam  1,000 mg Intravenous Q12H  . pantoprazole sodium  40 mg Per Tube Daily  . phenytoin (DILANTIN) IV  100 mg Intravenous 3 times per day  . piperacillin-tazobactam (ZOSYN)  IV  3.375 g Intravenous 3 times per day    Assessment/Plan: 73 year old male s/p vfib arrest.  EEG reviewed and shows evidence of significant brain injury although no seizure acitivity noted even of fsedation.  CT showed no significant acute changes.  Conversation had with family today.  Prognosis poor for functional recovery but patient clearly not brain dead.    Recommendations: 1.  LTM discontinued 2.  MRI of the brain without contrast. 3.  Continue Dilantin and Keppra 4.  Seizure precautions       LOS: 7 days   Thana FarrLeslie Marvens Hollars, MD [facility] [phone] 03/11/2014  5:18 PM

## 2014-03-11 DEATH — deceased

## 2014-03-12 ENCOUNTER — Inpatient Hospital Stay (HOSPITAL_COMMUNITY): Payer: Medicare Other

## 2014-03-12 ENCOUNTER — Ambulatory Visit: Payer: Medicare Other | Admitting: Neurology

## 2014-03-12 LAB — URINE CULTURE
CULTURE: NO GROWTH
Colony Count: NO GROWTH

## 2014-03-12 LAB — CBC WITH DIFFERENTIAL/PLATELET
BASOS ABS: 0 10*3/uL (ref 0.0–0.1)
BASOS PCT: 0 % (ref 0–1)
Eosinophils Absolute: 0 10*3/uL (ref 0.0–0.7)
Eosinophils Relative: 0 % (ref 0–5)
HCT: 29.5 % — ABNORMAL LOW (ref 39.0–52.0)
Hemoglobin: 9.8 g/dL — ABNORMAL LOW (ref 13.0–17.0)
LYMPHS PCT: 8 % — AB (ref 12–46)
Lymphs Abs: 0.4 10*3/uL — ABNORMAL LOW (ref 0.7–4.0)
MCH: 32.3 pg (ref 26.0–34.0)
MCHC: 33.2 g/dL (ref 30.0–36.0)
MCV: 97.4 fL (ref 78.0–100.0)
Monocytes Absolute: 0.3 10*3/uL (ref 0.1–1.0)
Monocytes Relative: 6 % (ref 3–12)
Neutro Abs: 4.5 10*3/uL (ref 1.7–7.7)
Neutrophils Relative %: 86 % — ABNORMAL HIGH (ref 43–77)
PLATELETS: 78 10*3/uL — AB (ref 150–400)
RBC: 3.03 MIL/uL — ABNORMAL LOW (ref 4.22–5.81)
RDW: 15 % (ref 11.5–15.5)
WBC: 5.3 10*3/uL (ref 4.0–10.5)

## 2014-03-12 LAB — GLUCOSE, CAPILLARY
GLUCOSE-CAPILLARY: 152 mg/dL — AB (ref 70–99)
GLUCOSE-CAPILLARY: 141 mg/dL — AB (ref 70–99)
GLUCOSE-CAPILLARY: 151 mg/dL — AB (ref 70–99)
Glucose-Capillary: 129 mg/dL — ABNORMAL HIGH (ref 70–99)
Glucose-Capillary: 144 mg/dL — ABNORMAL HIGH (ref 70–99)
Glucose-Capillary: 152 mg/dL — ABNORMAL HIGH (ref 70–99)
Glucose-Capillary: 122 mg/dL — ABNORMAL HIGH (ref 70–99)

## 2014-03-12 LAB — PRO B NATRIURETIC PEPTIDE: PRO B NATRI PEPTIDE: 3803 pg/mL — AB (ref 0–450)

## 2014-03-12 LAB — HEPARIN LEVEL (UNFRACTIONATED): HEPARIN UNFRACTIONATED: 0.49 [IU]/mL (ref 0.30–0.70)

## 2014-03-12 LAB — BASIC METABOLIC PANEL
ANION GAP: 10 (ref 5–15)
BUN: 32 mg/dL — ABNORMAL HIGH (ref 6–23)
CHLORIDE: 105 meq/L (ref 96–112)
CO2: 27 mEq/L (ref 19–32)
Calcium: 8.2 mg/dL — ABNORMAL LOW (ref 8.4–10.5)
Creatinine, Ser: 0.74 mg/dL (ref 0.50–1.35)
GFR, EST NON AFRICAN AMERICAN: 88 mL/min — AB (ref >90)
Glucose, Bld: 142 mg/dL — ABNORMAL HIGH (ref 70–99)
Potassium: 4.4 mEq/L (ref 3.7–5.3)
SODIUM: 142 meq/L (ref 137–147)

## 2014-03-12 LAB — PROCALCITONIN: Procalcitonin: 0.1 ng/mL

## 2014-03-12 LAB — PHOSPHORUS: PHOSPHORUS: 3 mg/dL (ref 2.3–4.6)

## 2014-03-12 LAB — MAGNESIUM: MAGNESIUM: 2.1 mg/dL (ref 1.5–2.5)

## 2014-03-12 MED ORDER — SODIUM CHLORIDE 0.9 % IJ SOLN
10.0000 mL | Freq: Two times a day (BID) | INTRAMUSCULAR | Status: DC
  Administered 2014-03-12 – 2014-03-16 (×9): 10 mL

## 2014-03-12 MED ORDER — LEVETIRACETAM 100 MG/ML PO SOLN
1000.0000 mg | Freq: Two times a day (BID) | ORAL | Status: DC
  Administered 2014-03-12 – 2014-03-16 (×9): 1000 mg
  Filled 2014-03-12 (×11): qty 10

## 2014-03-12 MED ORDER — SODIUM CHLORIDE 0.9 % IJ SOLN
10.0000 mL | INTRAMUSCULAR | Status: DC | PRN
  Administered 2014-03-16: 10 mL

## 2014-03-12 MED ORDER — METOPROLOL TARTRATE 25 MG/10 ML ORAL SUSPENSION
12.5000 mg | Freq: Two times a day (BID) | ORAL | Status: DC
  Administered 2014-03-12 – 2014-03-16 (×10): 12.5 mg
  Filled 2014-03-12 (×12): qty 5

## 2014-03-12 NOTE — Progress Notes (Signed)
RT Replaced adaptor on ETT tube and changed tube holder.

## 2014-03-12 NOTE — Progress Notes (Addendum)
SUBJECTIVE:  73 y/o male with previous CVA admitted witnesses OOH arrest requiring CPR and cooling. 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 His anatomy is not favorable for PCI. He will need consideration for CABG if he recovers neurological function. LVEF is 25% by LV gram. Echo with LVEF=30-35%.   Remains Intubated. Sedated. Nonresponsive. Continues to be monitored with EEG.  Propofol off.  Now off of Levophed.  He was biting the ET tube with sedation decreased.     VT arrest on 6/28.  Shocked with 150J and quickly returned to his baseline AF.      Tele: sinus brady, PVCs  BP 143/55  Pulse 81  Temp(Src) 100.2 F (37.9 C) (Oral)  Resp 21  Ht 6' (1.829 m)  Wt 210 lb 8.6 oz (95.5 kg)  BMI 28.55 kg/m2  SpO2 98%  Intake/Output Summary (Last 24 hours) at 03/12/14 0902 Last data filed at 03/12/14 0800  Gross per 24 hour  Intake 2746.16 ml  Output   1485 ml  Net 1261.16 ml    PHYSICAL EXAM General: Intubated. Does not respond to voice or pain. Involuntary eyelid twitching Neck: No JVD. No masses noted.  Lungs: Mechanical breath sounds bilaterally.  Heart: Irregular with no murmurs noted.  Abdomen: Bowel sounds are present. Soft, non-tender.  Extremities: No lower extremity edema. 2+ DP pulses bilaterally  LABS: Basic Metabolic Panel:  Recent Labs  16/06/9605/30/15 0615 03/12/14 0400  NA 142 142  K 4.4 4.4  CL 106 105  CO2 26 27  GLUCOSE 156* 142*  BUN 21 32*  CREATININE 0.77 0.74  CALCIUM 8.0* 8.2*  MG 1.9 2.1  PHOS 2.6 3.0   CBC:  Recent Labs  03/10/14 0615 03/12/14 0400  WBC 4.7 5.3  NEUTROABS 3.6 4.5  HGB 11.2* 9.8*  HCT 33.3* 29.5*  MCV 96.8 97.4  PLT 66* 78*   Cardiac Enzymes: No results found for this basename: CKTOTAL, CKMB, CKMBINDEX, TROPONINI,  in the last 72 hours Current Meds: . antiseptic oral rinse  15 mL Mouth Rinse QID  . aspirin  81 mg Per Tube Daily  . atorvastatin  80 mg Per NG tube q1800  . chlorhexidine  15 mL Mouth  Rinse BID  . insulin aspart  0-15 Units Subcutaneous 6 times per day  . ipratropium-albuterol  3 mL Nebulization Q6H  . levETIRAcetam  1,000 mg Intravenous Q12H  . pantoprazole sodium  40 mg Per Tube Daily  . phenytoin (DILANTIN) IV  100 mg Intravenous 3 times per day  . piperacillin-tazobactam (ZOSYN)  IV  3.375 g Intravenous 3 times per day   Cardiac cath 03/05/14:  Left mainstem: There is diffuse calcification with 40% ostial left main disease. The left main tapers distally to 30%.  Left anterior descending (LAD): The LAD is a large vessel that gives rise to a moderately large diagonal branch. In the proximal LAD there is an aneurysmal segment followed immediately by a severe bifurcation stenosis 1:1:1 in the LAD and first diagonal up to 95%.  There is a moderately large ramus branch without significant disease.  Left circumflex (LCx): The LCx has mild ostial disease but otherwise has no significant stenosis.  Right coronary artery (RCA): Dominant vessel with 70% proximal stenosis. The crux of the RCA is severely diseased up to 90%. The Distal RCA is occluded with right to right and left to right collaterals.  Left ventriculography: Left ventricular systolic function is abnormal, LV size is increased with extensive inferior akinesis  and global hypokinesis. LVEF is estimated at 25%, there is mild to moderate mitral regurgitation   Echo 03/05/14: Left ventricle: The cavity size was moderately dilated. Systolic function was moderately to severely reduced. The estimated ejection fraction was in the range of 30% to 35%. There is akinesis of the entireinferior myocardium. There is akinesis of the basal-midinferolateral myocardium. - Aortic valve: Moderate thickening and calcification, consistent with sclerosis. There was trivial regurgitation. - Ascending aorta: The ascending aorta was mildly dilated. - Left atrium: The atrium was mildly dilated.  ASSESSMENT AND PLAN:   1. Ventricular  fibrillation arrest, out of hospital: Witnessed arrest at home. Likely primary arrythmia event with ischemic cardiomyopathy. Bystander CPR. Admitted by CCM. Pt found to have high grade CAD with complex disease involving the LAD/Diagonal bifurcation and severe disease in the RCA but these lesions were not acute. No PCI performed. S/p Cooling protocol per PCCM. Recurrent VT on 6/28. Shocked. Stopped amio due to bradycardia with seizure meds. K 4.4 mg 1.9 on 6/30. Mg  supplemented yesterday.  2. CAD: Severe double vessel disease by cath 03/05/14. His anatomy is not favorable for PCI. He would need consideration for CABG if he recovers neurological function.On ASA. b-blocker stopped due to severe bradycardia earlier. LVEF is 25% by LV gram. Echo with LVEF=30-35%.   Neurology reports that there is "Prognosis poor for functional recovery."  He is not a candidate for revascularization, either with PCI or CABG- unless his neuro status improves significantly.    3. Ischemic cardiomyopathy: As above. Will follow for now. No signs of ADHF. Resume ACe/ when BP allows.  Off of Levophed and propofol.  BP stable today.  Restart low dose metoprolol 12 .5 mg BID given that BP is better.  4. Atrial fib/AFL: rate controlled. No Amio despite VT due to bradycardia. Continue heparin.  Watch Hbg, as it has dropped from 2 days ago.  5. Anoxic brain injury:  Off sedation.  MRI later today.    6. H/o CVA        Hailynn Slovacek S. MD  7/2/20159:02 AM

## 2014-03-12 NOTE — Progress Notes (Signed)
PULMONARY / CRITICAL CARE MEDICINE   Name: Kyle [name] Kyle [name] [name] MRN: [id number] DOB: 07/24/1941    ADMISSION DATE:  October 18, 2013 CONSULTATION DATE:  October 18, 2013  REFERRING MD :  EDP PRIMARY SERVICE: PCCM  CHIEF COMPLAINT:  Cardiac arrest  BRIEF Kyle [name] [name] DESCRIPTION: 73 year old male with PMH MI, CVA, A-fib, and seizures suffered witnessed cardiac arrest 6/24.  Initial rhythm VF. ROSC achieved with defibrillation, CPR and amiodarone. In ED found to have ECG changes. PCCM asked to see for admission.   SIGNIFICANT EVENTS / STUDIES:  6/24 adm after VF arrest. Hypothermia initiated 6/24 CT head: No acute intracranial process. Remote CVA 6/24 CTA chest - No pulmonary emboli. 10 mm nodule in RUL. Multiple bilateral anterior rib fractures. 6/24 LHC: severe 2 vessel obstructive CAD - not amenable to PCI. Severe LV dysfunction (LVEF 25%). Elevated LV pressures. Rec med rx. Consider CABG in future 6/25 TTE:  LVEF 30-35%, mod dil LV, akinesis of entire inf myocardium, basal-mid inferolateral myocardium, sclerosis of aortic valve, mod dil aorta and mod dil LA  6/27: rewarmed. PEr elink report: myoclonus and therefore versed started. Per Nursing: opened eyes, has gag but not following commands -? On sedation gtt. Formal WUA in progress. Also, amio started for a flutter/a tach  03/08/14: Intermittent seizures controlled by diprivan. Not responsive on wua. EEG ordered.  CT head - no anoxia, remote posterior LMCA stroke. Off amio gtt; still in A Fib  03/09/14: V T arrestyesterday.  EEG with diprivian in progress. Also on diprivan and keppra and per neuro no seizures. No family at bedside  03/10/14: On keppra and dilantin and very low dose diprivan: neuro feels he is better with no seizures and regain in corneal, gag and pupil, RASS -3   03/11/14: Got dysnch with vent end of day yesterday and diprivan back on. Per Kyle [name] Kyle [name] Kyle [name] [name]: he is agitated, bites tube, opens eyes to voice but does not follow commands or is purposeful./  Currently 30 min off diprivan and RASS -4. Having low grade fever. OFf levophed    LINES / TUBES: R - port-a-cath (chronic) [facility] CVL 6/24 >>> ETT 6/24 >>>  CULTURES: Urine 6/24 >>> NEG MRSA PCR 6/24 >>> NEG  ANTIBIOTICS: Anti-infectives   Start     Dose/Rate Route Frequency Ordered Stop   03/11/14 1000  piperacillin-tazobactam (ZOSYN) IVPB 3.375 g     3.375 g 12.5 mL/hr over 240 Minutes Intravenous 3 times per day 03/11/14 [id number]        SUBJECTIVE:   03/12/14 : off diprivan - does not follow command or track. BUt opens eyes to voice sometimes. Does not move all 4s. Moves feet to pain. EEG suggest anoxic encephalopathy per neuro. MRI pending   VITAL SIGNS: Temp:  [98.7 F (37.1 C)-100.2 F (37.9 C)] 100.2 F (37.9 C) (07/02 0730) Pulse Rate:  [60-114] 60 (07/02 1100) Resp:  [11-26] 20 (07/02 1100) BP: (103-153)/(53-84) 112/55 mmHg (07/02 1100) SpO2:  [96 %-100 %] 98 % (07/02 1100) FiO2 (%):  [30 %] 30 % (07/02 1100) Weight:  [95.5 kg (210 lb 8.6 oz)] 95.5 kg (210 lb 8.6 oz) (07/02 0417)  HEMODYNAMICS:    VENTILATOR SETTINGS: Vent Mode:  [-] PRVC FiO2 (%):  [30 %] 30 % Set Rate:  [14 bmp] 14 bmp Vt Set:  [550 mL] 550 mL PEEP:  [5 cmH20] 5 cmH20 Plateau Pressure:  [14 cmH20-21 cmH20] 18 cmH20  INTAKE / OUTPUT: Intake/Output     07/01 0701 - 07/02 0700 07/02 0701 - 07/03 0700  I.V. (mL/kg) 688.7 (7.2) 75.5 (0.8)   NG/GT 1800 310   IV Piggyback 370 110   Total Intake(mL/kg) 2858.7 (29.9) 495.5 (5.2)   Urine (mL/kg/hr) 1505 (0.7) 280 (0.7)   Total Output 1505 280   Net +1353.7 +215.5        Stool Occurrence 1 x     PHYSICAL EXAMINATION: General: Looks critically ill Neuro: On keppra and dilantin and off diprivan   Corneal +, gag and pupil +, RASS -3 HEENT:  Kyle [name] Kyle [name] [name], pupils asymmetric with R cataract Cardiovascular: IRIR, no M noted Lungs:  Clear anteriorly Abdomen:  Soft, non-distended. BS + Ext. Mild peripheral edema.  LABS:  PULMONARY  Recent  Labs Lab 03/06/14 0500  PHART 7.410  PCO2ART 31.6*  PO2ART 113.0*  HCO3 20.4  TCO2 21.5  O2SAT 98.8    CBC  Recent Labs Lab 03/09/14 0443 03/10/14 0615 03/12/14 0400  HGB 12.3* 11.2* 9.8*  HCT 36.3* 33.3* 29.5*  WBC 8.2 4.7 5.3  PLT 93* 66* 78*    COAGULATION  Recent Labs Lab 03/05/14 1430 03/09/14 0443  INR 2.01* 1.57*    CARDIAC   No results found for this basename: TROPONINI,  in Kyle [name] last 168 hours  Recent Labs Lab 03/12/14 0500  PROBNP 3803.0*     CHEMISTRY  Recent Labs Lab 03/07/14 0437 03/07/14 0924 03/08/14 0430 03/09/14 0443 03/10/14 0615 03/12/14 0400  NA 139  --  138 140 142 142  K 4.2  --  3.8 4.1 4.4 4.4  CL 106  --  104 104 106 105  CO2 21  --  24 24 26 27   GLUCOSE 151*  --  148* 150* 156* 142*  BUN 19  --  25* 23 21 32*  CREATININE 0.98  --  0.85 0.82 0.77 0.74  CALCIUM 8.2*  --  8.4 8.3* 8.0* 8.2*  MG  --  2.1 2.0 2.2 1.9 2.1  PHOS  --  1.8* 1.9* 3.4 2.6 3.0   Estimated Creatinine Clearance: 95.7 ml/min (by C-G formula based on Cr of 0.74).   LIVER  Recent Labs Lab 03/05/14 1430 03/09/14 0443  INR 2.01* 1.57*     INFECTIOUS  Recent Labs Lab 03/05/14 2000 03/06/14 0235 03/07/14 0807 03/11/14 0904 03/12/14 0400  LATICACIDVEN 3.0* 2.7* 1.1  --   --   PROCALCITON  --   --   --  0.10 0.10     ENDOCRINE CBG (last 3)   Recent Labs  03/11/14 2319 03/12/14 0414 03/12/14 0737  GLUCAP 129* 144* 152*         IMAGING x48h  Dg Chest Port 1 View  03/12/2014   CLINICAL DATA:  Evaluate endotracheal tube.  EXAM: PORTABLE CHEST - 1 VIEW  COMPARISON:  None.  FINDINGS: Kyle [name] heart size and mediastinal contours are stable. Endotracheal tube is identified distal tip 7.7 cm from carina. Right porta catheter is unchanged. Left internal jugular central line is unchanged with tip in Kyle [name] superior vena cava. Nasogastric tube is noted with distal tip not included on Kyle [name] film but is at least in Kyle [name] stomach. There is increased  patchy opacity of Kyle [name] right mid and lung base. There is mild atelectasis of left lung base. Kyle [name] visualized skeletal structures are stable.  IMPRESSION: Endotracheal tube distal tip 7.7 cm from carina. There is no pneumothorax.  Interval worsened patchy opacity of Kyle [name] right mid and lung base probably due to a combination of pneumonia and pleural effusion.   Electronically Signed   By:  Sherian ReinWei-Chen  Lin M.D.   On: 03/12/2014 07:22   Dg Chest Port 1 View  03/11/2014   CLINICAL DATA:  Endotracheal tube placement.  EXAM: PORTABLE CHEST - 1 VIEW  COMPARISON:  Multiple recent previous exams.  FINDINGS: 01/2027 hrs. Endotracheal tube tip is 7.6 cm above Kyle [name] base of Kyle [name] carina. Kyle [name] Kyle [name] [name] is rotated to Kyle [name] right. Kyle [name] cardio pericardial silhouette is enlarged. Kyle [name] bibasilar collapse/ consolidation persists without interval change. Left costophrenic angle is not included on Kyle [name] film. Kyle [name] NG tube passes into Kyle [name] stomach although Kyle [name] distal tip position is not included on Kyle [name] film. Left IJ central line tip is positioned over Kyle [name] mid SVC. Kyle [name] right Port-A-Cath remains in place with tip overlying Kyle [name] junction of Kyle [name] SVC and RA. Telemetry leads overlie Kyle [name] chest.  IMPRESSION: No substantial interval change in cardiopulmonary exam. There is bibasilar collapse/ consolidation.   Electronically Signed   By: Kennith CenterEric  Mansell M.D.   On: 03/11/2014 07:27      ASSESSMENT / PLAN:  PULMONARY A: Acute respiratory failure 2nd to cardiac arrest H/o COPD Lung nodule on CT   - does not meet sbt/extubation criteria due to mental status.    P:   Cont full vent support -  Cont vent bundle Daily SBT if/when meets criteria  CARDIOVASCULAR A:  VF arrest  Hypertension Atrial fib Critical CAD   -.  Had VT 03/08/14 and shocked.   Off amio 6/30 and levophed 7/11. But still on  heparin IV gtt P:   MAP goal to 65 mmHg Cards following Consider CABG if neuro recovery good  RENAL A:   normal  P:   Monitor  Correct electrolytes  as indicated  GASTROINTESTINAL A:   No issues P:   SUP: IV PPI TFs 6/26  HEMATOLOGIC A:   Chronic warfarin therapy for CAF Thrombocytopenia of uncertain etiology but no change on IV heparin P:  DVT px: SCDs Monitor CBC intermittently Transfuse per usual ICU guidelines Holding warfarin but on IV heparin gtt per cards  INFECTIOUS A:  Running low grade temp but PCT normal. Cultures pending  P:   Empiric  zosyn DC zosyn if cultures negative and PCT trends out normal  ENDOCRINE A:    Hyperglycemia without prior DM P:   Cont SSI  NEUROLOGIC A:   Acute encephalopathy, post anoxic H/o seizures, CVA   - having seizures 6/27 and 03/08/14  -  03/12/14: concern for anoxia. MRI pending  P:   Hold Diprivan  To extent possible PRN fentanyl only Daily WUA   03/08/14: Kyle [name] Kyle [name] [name] updated at bedside. T 03/09/14: No family at bedside 03/10/14: Kyle [name] Kyle [name] [name] updated over phone. Plan to monitor over next few to several days for wakefulness before deciding further goals 03/11/14: Kyle [name] Kyle [name] [name] updated. He says Kyle [name] [name] has purpose of living very actively and vibrant. He fears that Kyle [name] [name] would end SNF, Vent. LTAC; outcome he does not like. EXplained neuro prognosis is uncertain but better left to neuro; I have contacted Dr Thad Rangereynolds 03/12/14: no Kyle [name] at bedside   Kyle [name] Kyle [name] [name] is critically ill with multiple organ systems failure and requires high complexity decision making for assessment and support, frequent evaluation and titration of therapies, application of advanced monitoring technologies and extensive interpretation of multiple databases.   Critical Care Time devoted to Kyle [name] [name] care services described in this note is  35  Minutes.  Dr. Kalman ShanMurali Calvyn Kurtzman, M.D., [facility] HospitalF.C.C.P Pulmonary and Critical Care Medicine Staff Physician [facility]  Pager: [phone], If no answer or between  15:00h - 7:00h: call [phone]  03/12/2014 11:30 AM

## 2014-03-12 NOTE — Progress Notes (Signed)
Subjective: Patient off sedation.  Now opening eyes but does not follow commands.    Objective: Current vital signs: BP 147/73  Pulse 82  Temp(Src) 100 F (37.8 C) (Oral)  Resp 28  Ht 6' (1.829 m)  Wt 95.5 kg (210 lb 8.6 oz)  BMI 28.55 kg/m2  SpO2 98% Vital signs in last 24 hours: Temp:  [98.8 F (37.1 C)-100.2 F (37.9 C)] 100 F (37.8 C) (07/02 1130) Pulse Rate:  [60-87] 82 (07/02 1814) Resp:  [11-28] 28 (07/02 1814) BP: (112-153)/(53-84) 147/73 mmHg (07/02 1800) SpO2:  [96 %-100 %] 98 % (07/02 1814) FiO2 (%):  [30 %] 30 % (07/02 1814) Weight:  [95.5 kg (210 lb 8.6 oz)] 95.5 kg (210 lb 8.6 oz) (07/02 0417)  Intake/Output from previous day: 07/01 0701 - 07/02 0700 In: 2858.7 [I.V.:688.7; NG/GT:1800; IV Piggyback:370] Out: 1505 [Urine:1505] Intake/Output this shift: Total I/O In: 1243.5 [I.V.:272.7; NG/GT:810.8; IV Piggyback:160] Out: 865 [Urine:865] Nutritional status:    Neurologic Exam: Mental Status:  Opens eyes when name called and seems to focus on examiner and follow around the room. Does not follow commands. No verbalizations noted.  Cranial Nerves:  II: Blinks to confrontation bilaterally, pupils right clouded and left 3 mm,and reactive bilaterally  III,IV,VI: doll's response present bilaterally.  V,VII: corneal reflexes intact VIII: grossly intact  IX,X: gag reflex reduced, XI: trapezius strength unable to test bilaterally  XII: tongue strength unable to test  Motor:  Extremities flaccid throughout. No spontaneous movement noted. No purposeful movements noted.  Sensory:  Does not respond to noxious stimuli in any extremity.  Deep Tendon Reflexes:  1+ with absent AJ's bilaterally  Plantars:  upgoing bilaterally  Cerebellar:  Unable to perform   Lab Results: Basic Metabolic Panel:  Recent Labs Lab 03/07/14 0437 03/07/14 0924 03/08/14 0430 03/09/14 0443 03/10/14 0615 03/12/14 0400  NA 139  --  138 140 142 142  K 4.2  --  3.8 4.1 4.4 4.4   CL 106  --  104 104 106 105  CO2 21  --  24 24 26 27   GLUCOSE 151*  --  148* 150* 156* 142*  BUN 19  --  25* 23 21 32*  CREATININE 0.98  --  0.85 0.82 0.77 0.74  CALCIUM 8.2*  --  8.4 8.3* 8.0* 8.2*  MG  --  2.1 2.0 2.2 1.9 2.1  PHOS  --  1.8* 1.9* 3.4 2.6 3.0    Liver Function Tests: No results found for this basename: AST, ALT, ALKPHOS, BILITOT, PROT, ALBUMIN,  in the last 168 hours No results found for this basename: LIPASE, AMYLASE,  in the last 168 hours No results found for this basename: AMMONIA,  in the last 168 hours  CBC:  Recent Labs Lab 03/07/14 0437 03/08/14 0430 03/09/14 0443 03/10/14 0615 03/12/14 0400  WBC 6.3 4.8 8.2 4.7 5.3  NEUTROABS  --  3.8 6.1 3.6 4.5  HGB 12.7* 11.3* 12.3* 11.2* 9.8*  HCT 37.5* 33.6* 36.3* 33.3* 29.5*  MCV 94.7 94.9 96.0 96.8 97.4  PLT 62* 61* 93* 66* 78*    Cardiac Enzymes: No results found for this basename: CKTOTAL, CKMB, CKMBINDEX, TROPONINI,  in the last 168 hours  Lipid Panel:  Recent Labs Lab 03/07/14 1000 03/10/14 0615 03/10/14 2320  TRIG 69 60 86    CBG:  Recent Labs Lab 03/11/14 2017 03/11/14 2319 03/12/14 0414 03/12/14 0737 03/12/14 1142  GLUCAP 145* 129* 144* 152* 152*    Microbiology: Results for orders placed  during the hospital encounter of Mar 07, 2014  URINE CULTURE     Status: None   Collection Time    Mar 07, 2014  7:55 PM      Result Value Ref Range Status   Specimen Description URINE, CATHETERIZED   Final   Special Requests NONE   Final   Culture  Setup Time     Final   Value: 03/05/2014 01:10     Performed at [facility] Count     Final   Value: NO GROWTH     Performed at [facility]   Culture     Final   Value: NO GROWTH     Performed at [facility]   Report Status 03/05/2014 FINAL   Final  MRSA PCR SCREENING     Status: None   Collection Time    03/05/14 12:31 AM      Result Value Ref Range Status   MRSA by PCR NEGATIVE  NEGATIVE Final    Comment:            The GeneXpert MRSA Assay (FDA     approved for NASAL specimens     only), is one component of a     comprehensive MRSA colonization     surveillance program. It is not     intended to diagnose MRSA     infection nor to guide or     monitor treatment for     MRSA infections.  CULTURE, RESPIRATORY (NON-EXPECTORATED)     Status: None   Collection Time    03/11/14  9:54 AM      Result Value Ref Range Status   Specimen Description TRACHEAL ASPIRATE   Final   Special Requests NONE   Final   Gram Stain     Final   Value: MODERATE WBC PRESENT,BOTH PMN AND MONONUCLEAR     RARE SQUAMOUS EPITHELIAL CELLS PRESENT     FEW GRAM POSITIVE COCCI IN PAIRS     IN CLUSTERS FEW GRAM POSITIVE RODS     RARE GRAM NEGATIVE RODS   Culture     Final   Value: FEW GRAM NEGATIVE RODS     Performed at [facility]   Report Status PENDING   Incomplete  URINE CULTURE     Status: None   Collection Time    03/11/14  9:54 AM      Result Value Ref Range Status   Specimen Description URINE, CLEAN CATCH   Final   Special Requests NONE   Final   Culture  Setup Time     Final   Value: 03/11/2014 14:19     Performed at [facility] Count     Final   Value: NO GROWTH     Performed at [facility]   Culture     Final   Value: NO GROWTH     Performed at [facility]   Report Status 03/12/2014 FINAL   Final  CULTURE, BLOOD (ROUTINE X 2)     Status: None   Collection Time    03/11/14 11:10 AM      Result Value Ref Range Status   Specimen Description BLOOD LEFT ARM   Final   Special Requests BOTTLES DRAWN AEROBIC ONLY 7.5CC   Final   Culture  Setup Time     Final   Value: 03/11/2014 16:59     Performed at [facility]  Final   Value:        BLOOD CULTURE RECEIVED NO GROWTH TO DATE CULTURE WILL BE HELD FOR 5 DAYS BEFORE ISSUING A FINAL NEGATIVE REPORT     Performed at [facility]   Report Status PENDING   Incomplete   CULTURE, BLOOD (ROUTINE X 2)     Status: None   Collection Time    03/11/14 11:15 AM      Result Value Ref Range Status   Specimen Description BLOOD LEFT HAND   Final   Special Requests BOTTLES DRAWN AEROBIC AND ANAEROBIC 10CC   Final   Culture  Setup Time     Final   Value: 03/11/2014 16:59     Performed at [facility]   Culture     Final   Value:        BLOOD CULTURE RECEIVED NO GROWTH TO DATE CULTURE WILL BE HELD FOR 5 DAYS BEFORE ISSUING A FINAL NEGATIVE REPORT     Performed at [facility]   Report Status PENDING   Incomplete    Coagulation Studies: No results found for this basename: LABPROT, INR,  in the last 72 hours  Imaging: Mr Brain Wo Contrast  03/12/2014   CLINICAL DATA:  Altered mental status.  Past history of CVA.  EXAM: MRI HEAD WITHOUT CONTRAST  TECHNIQUE: Multiplanar, multiecho pulse sequences of the brain and surrounding structures were obtained without intravenous contrast.  COMPARISON:  CT head without contrast 03/07/2014. MRI brain 08/26/2013.  FINDINGS: The diffusion-weighted images demonstrate no evidence for acute or subacute infarction. The remote left temporal and parietal infarct is stable. No acute hemorrhage or mass lesion is present. Periventricular and subcortical white matter changes are otherwise stable.  The ventricles are of normal size. No significant extra-axial fluid collection is present.  Flow is present in the major intracranial arteries. The patient is status post left lens replacement. Cystic changes are noted within the lacrimal glands bilaterally. This is chronic on the right but new on the left. Mild mucosal thickening is present a maxillary and ethmoid air cells bilaterally. The sphenoid sinuses are clear. The frontal sinuses are hypoplastic. There is some fluid in the mastoid air cells bilaterally without an obstructing nasopharyngeal lesion.  IMPRESSION: 1. Chronic left temporal and parietal encephalomalacia. 2. No acute  intracranial abnormality. 3. 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 4. Stable atrophy and white matter disease. 5. Mild maxillary and ethmoid sinus disease.   Electronically Signed   By: Gennette Pac M.D.   On: 03/12/2014 18:05   Dg Chest Port 1 View  03/12/2014   CLINICAL DATA:  Endotracheal tube.  EXAM: PORTABLE CHEST - 1 VIEW  COMPARISON:  Same day.  FINDINGS: Stable cardiomediastinal silhouette. Endotracheal tube is in grossly good position with distal tip approximately 5 cm above the carina. Nasogastric tube is seen passing through the esophagus into the stomach. Right internal jugular Port-A-Cath is unchanged in position. No pneumothorax is noted. Stable left basilar opacity is noted consistent with subsegmental atelectasis. Stable right basilar opacity is noted most consistent with pleural effusion and associated atelectasis.  IMPRESSION: Endotracheal tube in grossly good position. Bilateral basilar opacities are unchanged compared to prior exam.   Electronically Signed   By: Roque Lias M.D.   On: 03/12/2014 14:50   Dg Chest Port 1 View  03/12/2014   CLINICAL DATA:  Evaluate endotracheal tube.  EXAM: PORTABLE CHEST - 1 VIEW  COMPARISON:  None.  FINDINGS: The heart size and mediastinal contours are stable. Endotracheal tube is identified distal tip 7.7 cm from carina. Right porta catheter is unchanged. Left internal jugular central line is unchanged with tip in the superior vena cava. Nasogastric tube is noted with distal tip not included on the film but is at least in the stomach. There is increased patchy opacity of the right mid and lung base. There is mild atelectasis of left lung base. The visualized skeletal structures are stable.  IMPRESSION: Endotracheal tube distal tip 7.7 cm from carina. There is no pneumothorax.  Interval worsened patchy opacity of the right mid and lung base probably due to a combination of pneumonia and pleural  effusion.   Electronically Signed   By: Sherian ReinWei-Chen  Lin M.D.   On: 03/12/2014 07:22   Dg Chest Port 1 View  03/11/2014   CLINICAL DATA:  Endotracheal tube placement.  EXAM: PORTABLE CHEST - 1 VIEW  COMPARISON:  Multiple recent previous exams.  FINDINGS: 01/2027 hrs. Endotracheal tube tip is 7.6 cm above the base of the carina. The patient is rotated to the right. The cardio pericardial silhouette is enlarged. The bibasilar collapse/ consolidation persists without interval change. Left costophrenic angle is not included on the film. The NG tube passes into the stomach although the distal tip position is not included on the film. Left IJ central line tip is positioned over the mid SVC. The right Port-A-Cath remains in place with tip overlying the junction of the SVC and RA. Telemetry leads overlie the chest.  IMPRESSION: No substantial interval change in cardiopulmonary exam. There is bibasilar collapse/ consolidation.   Electronically Signed   By: Kennith CenterEric  Mansell M.D.   On: 03/11/2014 07:27    Medications:  I have reviewed the patient's current medications. Scheduled: . antiseptic oral rinse  15 mL Mouth Rinse QID  . aspirin  81 mg Per Tube Daily  . atorvastatin  80 mg Per NG tube q1800  . chlorhexidine  15 mL Mouth Rinse BID  . insulin aspart  0-15 Units Subcutaneous 6 times per day  . ipratropium-albuterol  3 mL Nebulization Q6H  . levETIRAcetam  1,000 mg Per Tube BID  . metoprolol tartrate  12.5 mg Per Tube BID  . pantoprazole sodium  40 mg Per Tube Daily  . phenytoin (DILANTIN) IV  100 mg Intravenous 3 times per day  . piperacillin-tazobactam (ZOSYN)  IV  3.375 g Intravenous 3 times per day  . sodium chloride  10-40 mL Intracatheter Q12H    Assessment/Plan: Patient somewhat more alert today.  No seizure activity noted.  On Keppra and Dilantin.  MRI reviewed and shows evidence of his old infarct but no acute changes.    Recommendations: 1.  With no new abnormalities noted on imaging and  patient more awake would give more time.   2.  Continue current AED therapy 3.  Dilantin level in AM   LOS: 8 days   Thana FarrLeslie Janyah Singleterry, MD [facility] [phone] 03/12/2014  6:59 PM

## 2014-03-12 NOTE — Progress Notes (Signed)
ANTICOAGULATION CONSULT NOTE - Follow Up Consult  Pharmacy Consult for Heparin Indication: atrial fibrillation  No Known Allergies  Patient Measurements: Height: 6' (182.9 cm) Weight: 210 lb 8.6 oz (95.5 kg) IBW/kg (Calculated) : 77.6 Heparin Dosing Weight: 91kg  Vital Signs: Temp: 100.2 F (37.9 C) (07/02 0730) Temp src: Oral (07/02 0730) BP: 143/55 mmHg (07/02 0800) Pulse Rate: 81 (07/02 0800)  Labs:  Recent Labs  03/10/14 0615  03/10/14 2235 03/11/14 0600 03/12/14 0400  HGB 11.2*  --   --   --  9.8*  HCT 33.3*  --   --   --  29.5*  PLT 66*  --   --   --  78*  HEPARINUNFRC 0.15*  < > 0.52 0.54 0.49  CREATININE 0.77  --   --   --  0.74  < > = values in this interval not displayed.  Estimated Creatinine Clearance: 95.7 ml/min (by C-G formula based on Cr of 0.74).   Medications:  Heparin @ 1850 units/hr  Assessment: 73yom s/p cardiac arrest/hypothermia continues on heparin for afib. Heparin level is therapeutic. Hgb/Hct are down this morning. Plt count low but trending up. No bleeding reported.  Tmax 100.2, wbc normal, concern for VAP, zosyn started 7/1. Renal fx stable.  7/1 resp 7/1 bld x2 - 6/24 urine - ng  Goal of Therapy:  Heparin level 0.3-0.7 units/ml Monitor platelets by anticoagulation protocol: Yes   Plan:  1) Continue heparin at 1850 units/hr 2) Daily HL/CBC 3) Zosyn 3.375g IV q8 4) Follow up culture data  Sheppard CoilFrank Sophie Quiles PharmD., BCPS Clinical Pharmacist Pager [phone] 03/12/2014 8:58 AM

## 2014-03-12 NOTE — Progress Notes (Signed)
Noted urine on the floor, catheter drainage bag leaking with small hole. RN x2 in room to change the bag, seal was broken and sterile technique was used.

## 2014-03-13 ENCOUNTER — Inpatient Hospital Stay (HOSPITAL_COMMUNITY): Payer: Medicare Other

## 2014-03-13 DIAGNOSIS — J449 Chronic obstructive pulmonary disease, unspecified: Secondary | ICD-10-CM

## 2014-03-13 LAB — CULTURE, RESPIRATORY

## 2014-03-13 LAB — CBC WITH DIFFERENTIAL/PLATELET
Basophils Absolute: 0 10*3/uL (ref 0.0–0.1)
Basophils Relative: 0 % (ref 0–1)
Eosinophils Absolute: 0 10*3/uL (ref 0.0–0.7)
Eosinophils Relative: 0 % (ref 0–5)
HCT: 30.4 % — ABNORMAL LOW (ref 39.0–52.0)
HEMOGLOBIN: 10 g/dL — AB (ref 13.0–17.0)
LYMPHS ABS: 0.5 10*3/uL — AB (ref 0.7–4.0)
LYMPHS PCT: 11 % — AB (ref 12–46)
MCH: 32.3 pg (ref 26.0–34.0)
MCHC: 32.9 g/dL (ref 30.0–36.0)
MCV: 98.1 fL (ref 78.0–100.0)
MONOS PCT: 7 % (ref 3–12)
Monocytes Absolute: 0.3 10*3/uL (ref 0.1–1.0)
NEUTROS ABS: 4.1 10*3/uL (ref 1.7–7.7)
NEUTROS PCT: 82 % — AB (ref 43–77)
PLATELETS: 88 10*3/uL — AB (ref 150–400)
RBC: 3.1 MIL/uL — AB (ref 4.22–5.81)
RDW: 14.9 % (ref 11.5–15.5)
WBC: 5 10*3/uL (ref 4.0–10.5)

## 2014-03-13 LAB — GLUCOSE, CAPILLARY
GLUCOSE-CAPILLARY: 136 mg/dL — AB (ref 70–99)
Glucose-Capillary: 121 mg/dL — ABNORMAL HIGH (ref 70–99)
Glucose-Capillary: 161 mg/dL — ABNORMAL HIGH (ref 70–99)
Glucose-Capillary: 132 mg/dL — ABNORMAL HIGH (ref 70–99)
Glucose-Capillary: 136 mg/dL — ABNORMAL HIGH (ref 70–99)

## 2014-03-13 LAB — CULTURE, RESPIRATORY W GRAM STAIN

## 2014-03-13 LAB — BASIC METABOLIC PANEL
ANION GAP: 13 (ref 5–15)
BUN: 34 mg/dL — ABNORMAL HIGH (ref 6–23)
CALCIUM: 8.4 mg/dL (ref 8.4–10.5)
CHLORIDE: 103 meq/L (ref 96–112)
CO2: 24 meq/L (ref 19–32)
Creatinine, Ser: 0.71 mg/dL (ref 0.50–1.35)
GFR calc Af Amer: >90 mL/min (ref >90)
GFR calc non Af Amer: 90 mL/min — ABNORMAL LOW (ref >90)
GLUCOSE: 133 mg/dL — AB (ref 70–99)
POTASSIUM: 4.6 meq/L (ref 3.7–5.3)
SODIUM: 140 meq/L (ref 137–147)

## 2014-03-13 LAB — MAGNESIUM: MAGNESIUM: 2.1 mg/dL (ref 1.5–2.5)

## 2014-03-13 LAB — PHENYTOIN LEVEL, TOTAL: Phenytoin Lvl: 7.1 ug/mL — ABNORMAL LOW (ref 10.0–20.0)

## 2014-03-13 LAB — HEPARIN LEVEL (UNFRACTIONATED): Heparin Unfractionated: 0.41 IU/mL (ref 0.30–0.70)

## 2014-03-13 LAB — PROCALCITONIN

## 2014-03-13 LAB — PHOSPHORUS: PHOSPHORUS: 3.3 mg/dL (ref 2.3–4.6)

## 2014-03-13 MED ORDER — CAPTOPRIL 6.25 MG HALF TABLET
6.2500 mg | ORAL_TABLET | Freq: Three times a day (TID) | ORAL | Status: DC
  Administered 2014-03-13 – 2014-03-16 (×12): 6.25 mg via NASOGASTRIC
  Filled 2014-03-13 (×16): qty 1

## 2014-03-13 NOTE — Progress Notes (Signed)
SUBJECTIVE:  73 y/o male with previous CVA admitted witnessed OOH arrest requiring CPR and cooling. 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 His anatomy is not favorable for PCI. He will need consideration for CABG if he recovers neurological function. LVEF is 25% by LV gram. Echo with LVEF=30-35%.   Remains Intubated. Opens eyes but no purposeful response.   VT arrest on 6/28.  Shocked with 150J and quickly returned to his baseline AF.      Tele: afib with controlled ventricular response  BP 124/71  Pulse 68  Temp(Src) 100.4 F (38 C) (Oral)  Resp 19  Ht 6' (1.829 m)  Wt 202 lb 6.1 oz (91.8 kg)  BMI 27.44 kg/m2  SpO2 98%  Intake/Output Summary (Last 24 hours) at 03/13/14 0716 Last data filed at 03/13/14 0600  Gross per 24 hour  Intake 2495.49 ml  Output   1670 ml  Net 825.49 ml    PHYSICAL EXAM General: Intubated. Opens eyes, no purposeful movements Neck: No JVD.  Lungs: Diminished BS bilaterally Heart: Irregular  Abdomen: Soft, not distended Extremities: Trace edema.   LABS: Basic Metabolic Panel:  Recent Labs  45/40/9805/10/26 0400 03/13/14 0305  NA 142 140  K 4.4 4.6  CL 105 103  CO2 27 24  GLUCOSE 142* 133*  BUN 32* 34*  CREATININE 0.74 0.71  CALCIUM 8.2* 8.4  MG 2.1 2.1  PHOS 3.0 3.3   CBC:  Recent Labs  03/12/14 0400 03/13/14 0305  WBC 5.3 5.0  NEUTROABS 4.5 4.1  HGB 9.8* 10.0*  HCT 29.5* 30.4*  MCV 97.4 98.1  PLT 78* 88*  Current Meds: . antiseptic oral rinse  15 mL Mouth Rinse QID  . aspirin  81 mg Per Tube Daily  . atorvastatin  80 mg Per NG tube q1800  . chlorhexidine  15 mL Mouth Rinse BID  . insulin aspart  0-15 Units Subcutaneous 6 times per day  . ipratropium-albuterol  3 mL Nebulization Q6H  . levETIRAcetam  1,000 mg Per Tube BID  . metoprolol tartrate  12.5 mg Per Tube BID  . pantoprazole sodium  40 mg Per Tube Daily  . phenytoin (DILANTIN) IV  100 mg Intravenous 3 times per day  . piperacillin-tazobactam (ZOSYN)  IV   3.375 g Intravenous 3 times per day  . sodium chloride  10-40 mL Intracatheter Q12H   Cardiac cath 03/05/14:  Left mainstem: There is diffuse calcification with 40% ostial left main disease. The left main tapers distally to 30%.  Left anterior descending (LAD): The LAD is a large vessel that gives rise to a moderately large diagonal branch. In the proximal LAD there is an aneurysmal segment followed immediately by a severe bifurcation stenosis 1:1:1 in the LAD and first diagonal up to 95%.  There is a moderately large ramus branch without significant disease.  Left circumflex (LCx): The LCx has mild ostial disease but otherwise has no significant stenosis.  Right coronary artery (RCA): Dominant vessel with 70% proximal stenosis. The crux of the RCA is severely diseased up to 90%. The Distal RCA is occluded with right to right and left to right collaterals.  Left ventriculography: Left ventricular systolic function is abnormal, LV size is increased with extensive inferior akinesis and global hypokinesis. LVEF is estimated at 25%, there is mild to moderate mitral regurgitation   Echo 03/05/14: Left ventricle: The cavity size was moderately dilated. Systolic function was moderately to severely reduced. The estimated ejection fraction was in the range  of 30% to 35%. There is akinesis of the entireinferior myocardium. There is akinesis of the basal-midinferolateral myocardium. - Aortic valve: Moderate thickening and calcification, consistent with sclerosis. There was trivial regurgitation. - Ascending aorta: The ascending aorta was mildly dilated. - Left atrium: The atrium was mildly dilated.  ASSESSMENT AND PLAN:   1. Ventricular fibrillation arrest, out of hospital: Witnessed arrest at home. Likely primary arrythmia event with ischemic cardiomyopathy. Bystander CPR. Admitted by CCM. Pt found to have high grade CAD with complex disease involving the LAD/Diagonal bifurcation and severe disease in  the RCA but these lesions were not acute. No PCI performed. S/p Cooling protocol per PCCM. Recurrent VT on 6/28. Shocked. Stopped amio due to bradycardia with seizure meds.  2. CAD: Severe double vessel disease by cath 03/05/14. His anatomy is not favorable for PCI. He would need consideration for CABG if he recovers neurological function.On ASA, b-blocker and statin. LVEF is 25% by LV gram. Echo with LVEF=30-35%.   Neurology reports that there is "Prognosis poor for functional recovery."  He is not a candidate for revascularization, either with PCI or CABG- unless his neuro status improves significantly.    3. Ischemic cardiomyopathy: As above. Will follow for now. No signs of ADHF. Continue beta blocker. Add low dose ACEI (captopril 6.25 TID).  4. Atrial fib/AFL: rate controlled. Continue metoprolol. Continue heparin.    5. Anoxic brain injury:  Per neurology    6. H/o CVA  7. Fever: antibiotics per CCM    Olga MillersBrian Crenshaw MD  7/3/20157:16 AM

## 2014-03-13 NOTE — Progress Notes (Addendum)
PULMONARY / CRITICAL CARE MEDICINE   Name: Kyle [name] MRN: [id number] DOB: 1941-08-28    ADMISSION DATE:  02/19/2014 CONSULTATION DATE:  02/11/2014  REFERRING MD :  EDP PRIMARY SERVICE: PCCM  CHIEF COMPLAINT:  Cardiac arrest  BRIEF PATIENT DESCRIPTION: 73 year old male with PMH MI, CVA, A-fib, and seizures suffered witnessed cardiac arrest 6/24.  Initial rhythm VF. ROSC achieved with defibrillation, CPR and amiodarone. In ED found to have ECG changes. PCCM asked to see for admission.   SIGNIFICANT EVENTS / STUDIES:  6/24 adm after VF arrest. Hypothermia initiated 6/24 CT head: No acute intracranial process. Remote CVA 6/24 CTA chest - No pulmonary emboli. 10 mm nodule in RUL. Multiple bilateral anterior rib fractures. 6/24 LHC: severe 2 vessel obstructive CAD - not amenable to PCI. Severe LV dysfunction (LVEF 25%). Elevated LV pressures. Rec med rx. Consider CABG in future 6/25 TTE:  LVEF 30-35%, mod dil LV, akinesis of entire inf myocardium, basal-mid inferolateral myocardium, sclerosis of aortic valve, mod dil aorta and mod dil LA  6/27: rewarmed. PEr elink report: myoclonus and therefore versed started. Per Nursing: opened eyes, has gag but not following commands -? On sedation gtt. Formal WUA in progress. Also, amio started for a flutter/a tach  03/08/14: Intermittent seizures controlled by diprivan. Not responsive on wua. EEG ordered.  CT head - no anoxia, remote posterior LMCA stroke. Off amio gtt; still in A Fib  03/09/14: V T arrestyesterday.  EEG with diprivian in progress. Also on diprivan and keppra and per neuro no seizures. No family at bedside  03/10/14: On keppra and dilantin and very low dose diprivan: neuro feels he is better with no seizures and regain in corneal, gag and pupil, RASS -3   03/11/14: Got dysnch with vent end of day yesterday and diprivan back on. Per son [name]: he is agitated, bites tube, opens eyes to voice but does not follow commands or is purposeful./  Currently 30 min off diprivan and RASS -4. Having low grade fever. OFf levophed    LINES / TUBES: R - port-a-cath (chronic) [facility] CVL 6/24 >>> ETT 6/24 >>>  CULTURES: Urine 6/24 >>> NEG MRSA PCR 6/24 >>> NEG  ANTIBIOTICS: Anti-infectives   Start     Dose/Rate Route Frequency Ordered Stop   03/11/14 1000  piperacillin-tazobactam (ZOSYN) IVPB 3.375 g     3.375 g 12.5 mL/hr over 240 Minutes Intravenous 3 times per day 03/11/14 4098        SUBJECTIVE:   03/13/14 : off diprivan - does not follow command or track. BUt opens eyes to voice sometimes. Does not move all 4s. Moves feet to pain. EEG suggest anoxic encephalopathy per neuro. MRI no acute findings.  No real improvement in tracking. RR >40 on PS 5/5 so failed SBT 03/13/14   VITAL SIGNS: Temp:  [99 F (37.2 C)-100.4 F (38 C)] 99.6 F (37.6 C) (07/03 0729) Pulse Rate:  [60-93] 81 (07/03 0729) Resp:  [17-29] 23 (07/03 0729) BP: (99-169)/(55-99) 152/75 mmHg (07/03 0729) SpO2:  [96 %-100 %] 98 % (07/03 0729) FiO2 (%):  [30 %] 30 % (07/03 0729) Weight:  [91.8 kg (202 lb 6.1 oz)] 91.8 kg (202 lb 6.1 oz) (07/03 0400)  HEMODYNAMICS: CV stable    VENTILATOR SETTINGS: Vent Mode:  [-] PRVC FiO2 (%):  [30 %] 30 % Set Rate:  [14 bmp] 14 bmp Vt Set:  [550 mL] 550 mL PEEP:  [5 cmH20] 5 cmH20 Pressure Support:  [14 cmH20] 14 cmH20 Plateau  Pressure:  [15 cmH20-22 cmH20] 15 cmH20  INTAKE / OUTPUT: Intake/Output     07/02 0701 - 07/03 0700 07/03 0701 - 07/04 0700   I.V. (mL/kg) 513.2 (5.6)    NG/GT 1810.8    IV Piggyback 260    Total Intake(mL/kg) 2584 (28.1)    Urine (mL/kg/hr) 1670 (0.8)    Total Output 1670     Net +914          Stool Occurrence 1 x    Emesis Occurrence 1 x     PHYSICAL EXAMINATION: General: Looks critically ill Neuro: On keppra and dilantin and off diprivan   Corneal +, gag and pupil +,will not f/c, will not track, will open eyes to voice, RASS -1 HEENT:  [place]/AT, pupils asymmetric with R cataract, ETT  in place, plastic connector to vent hose pops off, now taped. This ETT was placed in the field Cardiovascular: IRIR, no M noted Lungs:  Clear anteriorly Abdomen:  Soft, non-distended. BS + Ext. Mild peripheral edema.  LABS:  PULMONARY No results found for this basename: PHART, PCO2, PCO2ART, PO2, PO2ART, HCO3, TCO2, O2SAT,  in the last 168 hours  CBC  Recent Labs Lab 03/10/14 0615 03/12/14 0400 03/13/14 0305  HGB 11.2* 9.8* 10.0*  HCT 33.3* 29.5* 30.4*  WBC 4.7 5.3 5.0  PLT 66* 78* 88*    COAGULATION  Recent Labs Lab 03/09/14 0443  INR 1.57*    CARDIAC   No results found for this basename: TROPONINI,  in the last 168 hours  Recent Labs Lab 03/12/14 0500  PROBNP 3803.0*     CHEMISTRY  Recent Labs Lab 03/08/14 0430 03/09/14 0443 03/10/14 0615 03/12/14 0400 03/13/14 0305  NA 138 140 142 142 140  K 3.8 4.1 4.4 4.4 4.6  CL 104 104 106 105 103  CO2 24 24 26 27 24   GLUCOSE 148* 150* 156* 142* 133*  BUN 25* 23 21 32* 34*  CREATININE 0.85 0.82 0.77 0.74 0.71  CALCIUM 8.4 8.3* 8.0* 8.2* 8.4  MG 2.0 2.2 1.9 2.1 2.1  PHOS 1.9* 3.4 2.6 3.0 3.3   Estimated Creatinine Clearance: 87.6 ml/min (by C-G formula based on Cr of 0.71).   LIVER  Recent Labs Lab 03/09/14 0443  INR 1.57*     INFECTIOUS  Recent Labs Lab 03/07/14 0807 03/11/14 0904 03/12/14 0400 03/13/14 0305  LATICACIDVEN 1.1  --   --   --   PROCALCITON  --  0.10 0.10 <0.10     ENDOCRINE CBG (last 3)   Recent Labs  03/12/14 2026 03/12/14 2323 03/13/14 0317  GLUCAP 122* 151* 136*    All imaging reviewed prior 48hrs.    ASSESSMENT / PLAN: Principal Problem:   Cardiac arrest Active Problems:   Seizure   Acute respiratory failure   Coronary atherosclerosis of native coronary artery   Cardiomyopathy, ischemic   Atrial fibrillation   Anoxic brain damage   COPD (chronic obstructive pulmonary disease) with chronic bronchitis   PULMONARY A: Acute respiratory failure  2nd to cardiac arrest H/o COPD Lung nodule on CT Poss HCAP, Lower lobe infiltrates better 7/3  - does not meet sbt/extubation criteria due to mental status on 03/13/14 and also RR too high, failed SBT 03/13/14   P:   Cont full vent support -  Cont vent bundle Daily SBT if/when meets criteria BDs  CARDIOVASCULAR A:  VF arrest  Hypertension Atrial fib Critical CAD   -.  Had VT 03/08/14 and shocked.   Off amio  6/30 and levophed 7/11. But still on  heparin IV gtt P:   MAP goal to 65 mmHg Cards following Consider CABG if neuro recovery good  RENAL A:   normal  P:   Monitor  Correct electrolytes as indicated  GASTROINTESTINAL A:   No issues P:   SUP: IV PPI TFs 6/26  HEMATOLOGIC A:   Chronic warfarin therapy for CAF Thrombocytopenia of uncertain etiology but no change on IV heparin P:  DVT px: SCDs Monitor CBC intermittently Transfuse per usual ICU guidelines Holding warfarin but on IV heparin gtt per cards  INFECTIOUS A:  Running low grade temp but PCT normal. Cultures pending GNR in resp cult 7/1, lower lobe infiltrates better  P:   Cont zosyn F/u c/s data  ENDOCRINE A:    Hyperglycemia without prior DM P:   Cont SSI  NEUROLOGIC A:   Acute encephalopathy, post anoxic  H/o seizures, CVA No further seizures since 03/11/14   P:   Hold Diprivan  To extent possible PRN fentanyl only    03/08/14: Son Trey PaulaJeff updated at bedside. T 03/09/14: No family at bedside 03/10/14: Fuller PlanSon Bryan updated over phone. Plan to monitor over next few to several days for wakefulness before deciding further goals 03/11/14: Son JEff updated. He says patient has purpose of living very actively and vibrant. He fears that patient would end SNF, Vent. LTAC; outcome he does not like. EXplained neuro prognosis is uncertain but better left to neuro; I have contacted Dr Thad Rangereynolds 03/12/14: no son at bedside 7/3 no family, will update by phone   TODAYS SUMMARY S/p Vfib arrest and severe  multivessel CAD and ischemic LV. Seizure hx and anoxic enceph s/p cooling.  Now Vent dep d/t neuro issues.  Plan CABG if clears neuro issues and off vent.  FOr 03/13/14 keep on vent .   The patient is critically ill with multiple organ systems failure and requires high complexity decision making for assessment and support, frequent evaluation and titration of therapies, application of advanced monitoring technologies and extensive interpretation of multiple databases.   Critical Care Time devoted to patient care services described in this note is  35  Minutes.  Dorcas Carrowatrick WrightMD Beeper  [phone]  Cell  [phone]  If no response or cell goes to voicemail, call beeper [phone]   03/13/2014 7:54 AM

## 2014-03-13 NOTE — Progress Notes (Signed)
ANTICOAGULATION CONSULT NOTE - Follow Up Consult  Pharmacy Consult for Heparin Indication: atrial fibrillation  No Known Allergies  Patient Measurements: Height: 6' (182.9 cm) Weight: 202 lb 6.1 oz (91.8 kg) IBW/kg (Calculated) : 77.6 Heparin Dosing Weight: 91kg  Vital Signs: Temp: 99.6 F (37.6 C) (07/03 0729) Temp src: Oral (07/03 0729) BP: 118/68 mmHg (07/03 0800) Pulse Rate: 72 (07/03 0800)  Labs:  Recent Labs  03/11/14 0600 03/12/14 0400 03/13/14 0305  HGB  --  9.8* 10.0*  HCT  --  29.5* 30.4*  PLT  --  78* 88*  HEPARINUNFRC 0.54 0.49 0.41  CREATININE  --  0.74 0.71    Estimated Creatinine Clearance: 87.6 ml/min (by C-G formula based on Cr of 0.71).   Medications:  Heparin @ 1850 units/hr  Assessment: 73yom s/p cardiac arrest/hypothermia continues on heparin for afib. Heparin level is therapeutic. Hgb/Hct are low but stable, platelets low but trending up. No bleeding reported.  Goal of Therapy:  Heparin level 0.3-0.7 units/ml Monitor platelets by anticoagulation protocol: Yes   Plan:  1) Continue heparin at 1850 units/hr 2) Follow up heparin level and CBC in AM  Fredrik RiggerMarkle, Rorey Bisson Sue 03/13/2014,8:43 AM

## 2014-03-13 NOTE — Progress Notes (Signed)
Subjective: Patient remains unchanged.  No seizure activity noted.  Patient remains on anticonvulsant therapy.    Objective: Current vital signs: BP 118/56  Pulse 67  Temp(Src) 99 F (37.2 C) (Oral)  Resp 24  Ht 6' (1.829 m)  Wt 91.8 kg (202 lb 6.1 oz)  BMI 27.44 kg/m2  SpO2 99% Vital signs in last 24 hours: Temp:  [99 F (37.2 C)-100.4 F (38 C)] 99 F (37.2 C) (07/03 1200) Pulse Rate:  [65-93] 67 (07/03 1300) Resp:  [18-29] 24 (07/03 1300) BP: (99-169)/(51-99) 118/56 mmHg (07/03 1300) SpO2:  [96 %-100 %] 99 % (07/03 1500) FiO2 (%):  [30 %] 30 % (07/03 1515) Weight:  [91.8 kg (202 lb 6.1 oz)] 91.8 kg (202 lb 6.1 oz) (07/03 0400)  Intake/Output from previous day: 07/02 0701 - 07/03 0700 In: 2584 [I.V.:513.2; NG/GT:1810.8; IV Piggyback:260] Out: 1670 [Urine:1670] Intake/Output this shift: Total I/O In: 602 [I.V.:169.5; NG/GT:420; IV Piggyback:12.5] Out: 460 [Urine:460] Nutritional status:    Neurologic Exam: Mental Status:  Opens eyes when name called and seems to focus on examiner and follow around the room. Does not follow commands. No verbalizations noted.  Cranial Nerves:  II: Blinks to confrontation bilaterally, pupils right clouded and left 3 mm,and reactive bilaterally  III,IV,VI: doll's response present bilaterally.  V,VII: corneal reflexes intact  VIII: grossly intact  IX,X: gag reflex reduced, XI: trapezius strength unable to test bilaterally  XII: tongue strength unable to test  Motor:  Extremities flaccid throughout. No spontaneous movement noted. No purposeful movements noted.  Sensory:  Does not respond to noxious stimuli in any extremity.  Deep Tendon Reflexes:  1+ with absent AJ's bilaterally  Plantars:  upgoing bilaterally  Cerebellar:  Unable to perform   Lab Results: Basic Metabolic Panel:  Recent Labs Lab 03/08/14 0430 03/09/14 0443 03/10/14 0615 03/12/14 0400 03/13/14 0305  NA 138 140 142 142 140  K 3.8 4.1 4.4 4.4 4.6  CL 104  104 106 105 103  CO2 24 24 26 27 24   GLUCOSE 148* 150* 156* 142* 133*  BUN 25* 23 21 32* 34*  CREATININE 0.85 0.82 0.77 0.74 0.71  CALCIUM 8.4 8.3* 8.0* 8.2* 8.4  MG 2.0 2.2 1.9 2.1 2.1  PHOS 1.9* 3.4 2.6 3.0 3.3    Liver Function Tests: No results found for this basename: AST, ALT, ALKPHOS, BILITOT, PROT, ALBUMIN,  in the last 168 hours No results found for this basename: LIPASE, AMYLASE,  in the last 168 hours No results found for this basename: AMMONIA,  in the last 168 hours  CBC:  Recent Labs Lab 03/08/14 0430 03/09/14 0443 03/10/14 0615 03/12/14 0400 03/13/14 0305  WBC 4.8 8.2 4.7 5.3 5.0  NEUTROABS 3.8 6.1 3.6 4.5 4.1  HGB 11.3* 12.3* 11.2* 9.8* 10.0*  HCT 33.6* 36.3* 33.3* 29.5* 30.4*  MCV 94.9 96.0 96.8 97.4 98.1  PLT 61* 93* 66* 78* 88*    Cardiac Enzymes: No results found for this basename: CKTOTAL, CKMB, CKMBINDEX, TROPONINI,  in the last 168 hours  Lipid Panel:  Recent Labs Lab 03/07/14 1000 03/10/14 0615 03/10/14 2320  TRIG 69 60 86    CBG:  Recent Labs Lab 03/12/14 2026 03/12/14 2323 03/13/14 0317 03/13/14 0741 03/13/14 1116  GLUCAP 122* 151* 136* 121* 161*    Microbiology: Results for orders placed during the hospital encounter of 02/09/2014  URINE CULTURE     Status: None   Collection Time    02/20/2014  7:55 PM  Result Value Ref Range Status   Specimen Description URINE, CATHETERIZED   Final   Special Requests NONE   Final   Culture  Setup Time     Final   Value: 03/05/2014 01:10     [facility]formed [facility]   Colony Count     Final   Value: NO GROWTH     [facility]formed at [facility]   Culture     Final   Value: NO GROWTH     Performed at [facility]   Report Status 03/05/2014 FINAL   Final  MRSA PCR SCREENING     Status: None   Collection Time    03/05/14 12:31 AM      Result Value Ref Range Status   MRSA by PCR NEGATIVE  NEGATIVE Final   Comment:            The GeneXpert MRSA Assay (FDA      approved for NASAL specimens     only), is one component of a     comprehensive MRSA colonization     surveillance program. It is not     intended to diagnose MRSA     infection nor to guide or     monitor treatment for     MRSA infections.  CULTURE, RESPIRATORY (NON-EXPECTORATED)     Status: None   Collection Time    03/11/14  9:54 AM      Result Value Ref Range Status   Specimen Description TRACHEAL ASPIRATE   Final   Special Requests NONE   Final   Gram Stain     Final   Value: MODERATE WBC PRESENT,BOTH PMN AND MONONUCLEAR     RARE SQUAMOUS EPITHELIAL CELLS PRESENT     FEW GRAM POSITIVE COCCI IN PAIRS     IN CLUSTERS FEW GRAM POSITIVE RODS     RARE GRAM NEGATIVE RODS   Culture     Final   Value: FEW KLEBSIELLA SPECIES     Performed at [facility]   Report Status 03/13/2014 FINAL   Final   Organism ID, Bacteria KLEBSIELLA SPECIES   Final  URINE CULTURE     Status: None   Collection Time    03/11/14  9:54 AM      Result Value Ref Range Status   Specimen Description URINE, CLEAN CATCH   Final   Special Requests NONE   Final   Culture  Setup Time     Final   Value: 03/11/2014 14:19     Performed at [facility]   Colony Count     Final   Value: NO GROWTH     Performed at [facility]   Culture     Final   Value: NO GROWTH     Performed at [facility]   Report Status 03/12/2014 FINAL   Final  CULTURE, BLOOD (ROUTINE X 2)     Status: None   Collection Time    03/11/14 11:10 AM      Result Value Ref Range Status   Specimen Description BLOOD LEFT ARM   Final   Special Requests BOTTLES DRAWN AEROBIC ONLY 7.5CC   Final   Culture  Setup Time     Final   Value: 03/11/2014 16:59     Performed at [facility]   Culture     Final   Value:        BLOOD CULTURE RECEIVED NO GROWTH TO DATE  CULTURE WILL BE HELD FOR 5 DAYS BEFORE ISSUING A FINAL NEGATIVE REPORT     Performed at [facility]   Report Status PENDING   Incomplete   CULTURE, BLOOD (ROUTINE X 2)     Status: None   Collection Time    03/11/14 11:15 AM      Result Value Ref Range Status   Specimen Description BLOOD LEFT HAND   Final   Special Requests BOTTLES DRAWN AEROBIC AND ANAEROBIC 10CC   Final   Culture  Setup Time     Final   Value: 03/11/2014 16:59     Performed at [facility]   Culture     Final   Value:        BLOOD CULTURE RECEIVED NO GROWTH TO DATE CULTURE WILL BE HELD FOR 5 DAYS BEFORE ISSUING A FINAL NEGATIVE REPORT     Performed at [facility]   Report Status PENDING   Incomplete    Coagulation Studies: No results found for this basename: LABPROT, INR,  in the last 72 hours  Imaging: Mr Brain Wo Contrast  03/12/2014   CLINICAL DATA:  Altered mental status.  Past history of CVA.  EXAM: MRI HEAD WITHOUT CONTRAST  TECHNIQUE: Multiplanar, multiecho pulse sequences of the brain and surrounding structures were obtained without intravenous contrast.  COMPARISON:  CT head without contrast 03/07/2014. MRI brain 08/26/2013.  FINDINGS: The diffusion-weighted images demonstrate no evidence for acute or subacute infarction. The remote left temporal and parietal infarct is stable. No acute hemorrhage or mass lesion is present. Periventricular and subcortical white matter changes are otherwise stable.  The ventricles are of normal size. No significant extra-axial fluid collection is present.  Flow is present in the major intracranial arteries. The patient is status post left lens replacement. Cystic changes are noted within the lacrimal glands bilaterally. This is chronic on the right but new on the left. Mild mucosal thickening is present a maxillary and ethmoid air cells bilaterally. The sphenoid sinuses are clear. The frontal sinuses are hypoplastic. There is some fluid in the mastoid air cells bilaterally without an obstructing nasopharyngeal lesion.  IMPRESSION: 1. Chronic left temporal and parietal encephalomalacia. 2. No acute  intracranial abnormality. 3. 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 4. Stable atrophy and white matter disease. 5. Mild maxillary and ethmoid sinus disease.   Electronically Signed   By: Gennette Pachris  Mattern M.D.   On: 03/12/2014 18:05   Dg Chest Port 1 View  03/13/2014   CLINICAL DATA:  Endotracheal tube.  EXAM: PORTABLE CHEST - 1 VIEW  COMPARISON:  Chest x-ray from yesterday.  FINDINGS: Endotracheal tube ends at the mid thoracic trachea level. There is a right IJ porta catheter, tip at the upper right atrial level. Left IJ catheter in similar position, tip at this upper SVC. The orogastric tube continues below the diaphragm.  There is mild cardiomegaly which is chronic. Inflation of the lower lungs is improving. Hazy density at the right base may reflect residual atelectasis or less likely layering pleural fluid. Retrocardiac aeration is nearly normalized. No pneumothorax. No pulmonary edema.  IMPRESSION: 1. Tubes and central lines remain in good position. 2. Improving basilar lung aeration, likely clearing atelectasis.   Electronically Signed   By: Tiburcio PeaJonathan  Watts M.D.   On: 03/13/2014 06:56   Dg Chest Port 1 View  03/12/2014   CLINICAL DATA:  Endotracheal tube.  EXAM: PORTABLE CHEST - 1 VIEW  COMPARISON:  Same  day.  FINDINGS: Stable cardiomediastinal silhouette. Endotracheal tube is in grossly good position with distal tip approximately 5 cm above the carina. Nasogastric tube is seen passing through the esophagus into the stomach. Right internal jugular Port-A-Cath is unchanged in position. No pneumothorax is noted. Stable left basilar opacity is noted consistent with subsegmental atelectasis. Stable right basilar opacity is noted most consistent with pleural effusion and associated atelectasis.  IMPRESSION: Endotracheal tube in grossly good position. Bilateral basilar opacities are unchanged compared to prior exam.   Electronically Signed   By: Roque Lias M.D.   On: 03/12/2014 14:50   Dg Chest Port 1 View  03/12/2014   CLINICAL DATA:  Evaluate endotracheal tube.  EXAM: PORTABLE CHEST - 1 VIEW  COMPARISON:  None.  FINDINGS: The heart size and mediastinal contours are stable. Endotracheal tube is identified distal tip 7.7 cm from carina. Right porta catheter is unchanged. Left internal jugular central line is unchanged with tip in the superior vena cava. Nasogastric tube is noted with distal tip not included on the film but is at least in the stomach. There is increased patchy opacity of the right mid and lung base. There is mild atelectasis of left lung base. The visualized skeletal structures are stable.  IMPRESSION: Endotracheal tube distal tip 7.7 cm from carina. There is no pneumothorax.  Interval worsened patchy opacity of the right mid and lung base probably due to a combination of pneumonia and pleural effusion.   Electronically Signed   By: Sherian Rein M.D.   On: 03/12/2014 07:22    Medications:  I have reviewed the patient's current medications. Scheduled: . antiseptic oral rinse  15 mL Mouth Rinse QID  . aspirin  81 mg Per Tube Daily  . atorvastatin  80 mg Per NG tube q1800  . captopril  6.25 mg Per NG tube TID  . chlorhexidine  15 mL Mouth Rinse BID  . insulin aspart  0-15 Units Subcutaneous 6 times per day  . ipratropium-albuterol  3 mL Nebulization Q6H  . levETIRAcetam  1,000 mg Per Tube BID  . metoprolol tartrate  12.5 mg Per Tube BID  . pantoprazole sodium  40 mg Per Tube Daily  . phenytoin (DILANTIN) IV  100 mg Intravenous 3 times per day  . piperacillin-tazobactam (ZOSYN)  IV  3.375 g Intravenous 3 times per day  . sodium chloride  10-40 mL Intracatheter Q12H    Assessment/Plan: Patient remains off sedation and unchanged.  MRI of the brain reviewed and shows no acute changes.  Chronic infarct noted.  No evidence of anoxic brain injury.  No further seizures noted.    Recommendations: 1.  Based on above information,  prognosis is not clear and would give the patient more time.  Will continue to follow with you.     LOS: 9 days   Thana Farr, MD [facility] [phone] 03/13/2014  3:39 PM

## 2014-03-14 ENCOUNTER — Inpatient Hospital Stay (HOSPITAL_COMMUNITY): Payer: Medicare Other

## 2014-03-14 DIAGNOSIS — J189 Pneumonia, unspecified organism: Secondary | ICD-10-CM

## 2014-03-14 DIAGNOSIS — I469 Cardiac arrest, cause unspecified: Secondary | ICD-10-CM

## 2014-03-14 DIAGNOSIS — J9600 Acute respiratory failure, unspecified whether with hypoxia or hypercapnia: Secondary | ICD-10-CM

## 2014-03-14 LAB — CBC WITH DIFFERENTIAL/PLATELET
BASOS PCT: 0 % (ref 0–1)
Basophils Absolute: 0 10*3/uL (ref 0.0–0.1)
EOS ABS: 0 10*3/uL (ref 0.0–0.7)
Eosinophils Relative: 0 % (ref 0–5)
HEMATOCRIT: 30.3 % — AB (ref 39.0–52.0)
HEMOGLOBIN: 10 g/dL — AB (ref 13.0–17.0)
Lymphocytes Relative: 8 % — ABNORMAL LOW (ref 12–46)
Lymphs Abs: 0.5 10*3/uL — ABNORMAL LOW (ref 0.7–4.0)
MCH: 32.3 pg (ref 26.0–34.0)
MCHC: 33 g/dL (ref 30.0–36.0)
MCV: 97.7 fL (ref 78.0–100.0)
MONOS PCT: 6 % (ref 3–12)
Monocytes Absolute: 0.3 10*3/uL (ref 0.1–1.0)
NEUTROS PCT: 86 % — AB (ref 43–77)
Neutro Abs: 5.2 10*3/uL (ref 1.7–7.7)
Platelets: 105 10*3/uL — ABNORMAL LOW (ref 150–400)
RBC: 3.1 MIL/uL — AB (ref 4.22–5.81)
RDW: 14.8 % (ref 11.5–15.5)
WBC: 6 10*3/uL (ref 4.0–10.5)

## 2014-03-14 LAB — BASIC METABOLIC PANEL
Anion gap: 15 (ref 5–15)
BUN: 37 mg/dL — AB (ref 6–23)
CHLORIDE: 104 meq/L (ref 96–112)
CO2: 21 meq/L (ref 19–32)
CREATININE: 0.71 mg/dL (ref 0.50–1.35)
Calcium: 8.6 mg/dL (ref 8.4–10.5)
GFR calc Af Amer: >90 mL/min (ref >90)
GFR calc non Af Amer: 90 mL/min — ABNORMAL LOW (ref >90)
GLUCOSE: 172 mg/dL — AB (ref 70–99)
POTASSIUM: 4.6 meq/L (ref 3.7–5.3)
Sodium: 140 mEq/L (ref 137–147)

## 2014-03-14 LAB — GLUCOSE, CAPILLARY
GLUCOSE-CAPILLARY: 115 mg/dL — AB (ref 70–99)
GLUCOSE-CAPILLARY: 116 mg/dL — AB (ref 70–99)
GLUCOSE-CAPILLARY: 128 mg/dL — AB (ref 70–99)
GLUCOSE-CAPILLARY: 129 mg/dL — AB (ref 70–99)
GLUCOSE-CAPILLARY: 131 mg/dL — AB (ref 70–99)
GLUCOSE-CAPILLARY: 162 mg/dL — AB (ref 70–99)
Glucose-Capillary: 124 mg/dL — ABNORMAL HIGH (ref 70–99)

## 2014-03-14 LAB — TRIGLYCERIDES: Triglycerides: 83 mg/dL (ref <150)

## 2014-03-14 LAB — HEPARIN LEVEL (UNFRACTIONATED): Heparin Unfractionated: 0.35 IU/mL (ref 0.30–0.70)

## 2014-03-14 MED ORDER — ROCURONIUM BROMIDE 50 MG/5ML IV SOLN
INTRAVENOUS | Status: AC
  Filled 2014-03-14: qty 2

## 2014-03-14 MED ORDER — LIDOCAINE HCL (CARDIAC) 20 MG/ML IV SOLN
INTRAVENOUS | Status: AC
  Filled 2014-03-14: qty 5

## 2014-03-14 MED ORDER — SUCCINYLCHOLINE CHLORIDE 20 MG/ML IJ SOLN
INTRAMUSCULAR | Status: AC
  Filled 2014-03-14: qty 1

## 2014-03-14 MED ORDER — FENTANYL BOLUS VIA INFUSION
25.0000 ug | INTRAVENOUS | Status: DC | PRN
  Filled 2014-03-14: qty 50

## 2014-03-14 MED ORDER — MIDAZOLAM HCL 2 MG/2ML IJ SOLN
INTRAMUSCULAR | Status: AC
  Filled 2014-03-14: qty 2

## 2014-03-14 MED ORDER — SODIUM CHLORIDE 0.9 % IV SOLN
0.0000 ug/h | INTRAVENOUS | Status: DC
  Administered 2014-03-14: 75 ug/h via INTRAVENOUS
  Administered 2014-03-16: 50 ug/h via INTRAVENOUS
  Filled 2014-03-14 (×2): qty 50

## 2014-03-14 MED ORDER — PROPOFOL 10 MG/ML IV BOLUS
120.0000 mg | Freq: Once | INTRAVENOUS | Status: AC
  Administered 2014-03-14: 120 mg via INTRAVENOUS
  Filled 2014-03-14: qty 20

## 2014-03-14 MED ORDER — ROCURONIUM BROMIDE 50 MG/5ML IV SOLN
50.0000 mg | Freq: Once | INTRAVENOUS | Status: AC
  Administered 2014-03-14: 50 mg via INTRAVENOUS
  Filled 2014-03-14: qty 5

## 2014-03-14 MED ORDER — FENTANYL CITRATE 0.05 MG/ML IJ SOLN: 50.0000 ug | Freq: Once | INTRAMUSCULAR | Status: DC

## 2014-03-14 MED ORDER — PROPOFOL 10 MG/ML IV EMUL
0.0000 ug/kg/min | INTRAVENOUS | Status: DC
  Administered 2014-03-14: 30 ug/kg/min via INTRAVENOUS
  Filled 2014-03-14 (×2): qty 100

## 2014-03-14 MED ORDER — ETOMIDATE 2 MG/ML IV SOLN
INTRAVENOUS | Status: AC
  Filled 2014-03-14: qty 20

## 2014-03-14 NOTE — Procedures (Signed)
Intubation Procedure Note Kyle [name] [id number] 04/28/1941  Procedure: Intubation Indications: Airway protection and maintenance  Procedure Details Consent: Unable to obtain consent because of emergent medical necessity. Time Out: Verified patient identification, verified procedure, site/side was marked, verified correct patient position, special equipment/implants available, medications/allergies/relevent history reviewed, required imaging and test results available.  Performed  Maximum sterile technique was used including gloves and mask.   Induced with 120 propofol and paralyzed with 50 mg rocuronium. Glidescope 3 used with grade 1 view. 7.5 ETT advanced to 25 cm at lip. Good color change. Initially decreased L breath sounds so ETT retracted to 23 cm with subsequent good breath sounds bilaterally.    Evaluation Hemodynamic Status: BP stable throughout; O2 sats: stable throughout Patient's Current Condition: stable Complications: No apparent complications Patient did tolerate procedure well. Chest X-ray ordered to verify placement.  CXR: pending.   Kyle [name] R. 03/14/2014

## 2014-03-14 NOTE — Progress Notes (Signed)
Pt coughing and gagging over vent and thrashing head from side to side. RN to bedside. Inline ETT suction performed as well as oral suction. Pt continued to cough and gag. Pt vomited green bile-like fluid. HOB immediately placed in high fowlers position and oral suctioning performed. OG placed on low-intermittent suction to remove stomach contents. RN assessed ETT placement and noted tube now ~19cm at lip with no tidal volumes via vent. CCM called. MD and RT at bedside for reintubation.

## 2014-03-14 NOTE — Progress Notes (Signed)
ANTICOAGULATION/ANTIBIOTIC CONSULT NOTE - Follow Up Consult  Pharmacy Consult for Heparin and Zosyn Indication: atrial fibrillation and HCAP  No Known Allergies  Patient Measurements: Height: 6' (182.9 cm) Weight: 203 lb 0.7 oz (92.1 kg) IBW/kg (Calculated) : 77.6 Heparin Dosing Weight: 92kg  Vital Signs: Temp: 99 F (37.2 C) (07/04 0700) Temp src: Oral (07/04 0700) BP: 127/57 mmHg (07/04 0800) Pulse Rate: 75 (07/04 0800)  Labs:  Recent Labs  03/12/14 0400 03/13/14 0305 03/14/14 0250  HGB 9.8* 10.0* 10.0*  HCT 29.5* 30.4* 30.3*  PLT 78* 88* 105*  HEPARINUNFRC 0.49 0.41 0.35  CREATININE 0.74 0.71 0.71    Estimated Creatinine Clearance: 87.6 ml/min (by C-G formula based on Cr of 0.71).   Medications:  Heparin @ 1850 units/hr  Assessment: 73yom s/p cardiac arrest/hypothermia continues on heparin for afib. Heparin level is therapeutic. Hgb/Hct are low but stable, platelets low but trending up. No bleeding reported.   Also continues on day#4 zosyn for HCAP with positive cultures. Renal function is stable. Dose appropriate. 7/1 zosyn>> 7/1 resp - klebsiella (pansensitive except resistant to ampicillin) 7/1 bld x2 - ngtd 6/24 urine - negative  Goal of Therapy:  Heparin level 0.3-0.7 units/ml Monitor platelets by anticoagulation protocol: Yes   Plan:  1) Continue heparin at 1850 units/hr 2) Follow up heparin level, CBC in AM 3) Continue zosyn 3.375g IV q8 (4 hour infusion)  Fredrik RiggerMarkle, Vick Filter Sue 03/14/2014,8:43 AM

## 2014-03-14 NOTE — Progress Notes (Addendum)
Called to bedside for possible self-extubation. On arrival ETT at ~ 19 cm at lip, no tidal volumes via vent. ETT removed and patient bagged while reintubation set up. See separate intubation note for procedural details. Will start propofol drip to prevent repeat episode. By report, patient vomited extensively prior to reintubation. Will make NPO over night.  CRITICAL CARE: The patient is critically ill with multiple organ systems failure and requires high complexity decision making for assessment and support, frequent evaluation and titration of therapies, application of advanced monitoring technologies and extensive interpretation of multiple databases. Critical Care Time devoted to patient care services described in this note is 15 minutes on top of the 35 min documented by Dr. Delford FieldWright earlier today.

## 2014-03-14 NOTE — Progress Notes (Signed)
SUBJECTIVE:  73 y/o male with previous CVA admitted witnessed OOH arrest requiring CPR and cooling. 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 His anatomy is not favorable for PCI. He will need consideration for CABG if he recovers neurological function. LVEF is 25% by LV gram. Echo with LVEF=30-35%.   Remains Intubated. Opens eyes but no purposeful response.   VT arrest on 6/28.  Shocked with 150J and quickly returned to his baseline AF.      Tele: afib with controlled ventricular response  BP 130/67  Pulse 74  Temp(Src) 100.1 F (37.8 C) (Oral)  Resp 18  Ht 6' (1.829 m)  Wt 203 lb 0.7 oz (92.1 kg)  BMI 27.53 kg/m2  SpO2 97%  Intake/Output Summary (Last 24 hours) at 03/14/14 16100712 Last data filed at 03/14/14 0700  Gross per 24 hour  Intake   2464 ml  Output   1685 ml  Net    779 ml    PHYSICAL EXAM General: Intubated. Opens eyes, no purposeful movements Neck: No JVD.  Lungs: CTA Heart: Irregular  Abdomen: Soft, not distended Extremities: Trace edema.   LABS: Basic Metabolic Panel:  Recent Labs  96/12/5405/02/15 0400 03/13/14 0305 03/14/14 0250  NA 142 140 140  K 4.4 4.6 4.6  CL 105 103 104  CO2 27 24 21   GLUCOSE 142* 133* 172*  BUN 32* 34* 37*  CREATININE 0.74 0.71 0.71  CALCIUM 8.2* 8.4 8.6  MG 2.1 2.1  --   PHOS 3.0 3.3  --    CBC:  Recent Labs  03/13/14 0305 03/14/14 0250  WBC 5.0 6.0  NEUTROABS 4.1 5.2  HGB 10.0* 10.0*  HCT 30.4* 30.3*  MCV 98.1 97.7  PLT 88* 105*  Current Meds: . antiseptic oral rinse  15 mL Mouth Rinse QID  . aspirin  81 mg Per Tube Daily  . atorvastatin  80 mg Per NG tube q1800  . captopril  6.25 mg Per NG tube TID  . chlorhexidine  15 mL Mouth Rinse BID  . insulin aspart  0-15 Units Subcutaneous 6 times per day  . ipratropium-albuterol  3 mL Nebulization Q6H  . levETIRAcetam  1,000 mg Per Tube BID  . metoprolol tartrate  12.5 mg Per Tube BID  . pantoprazole sodium  40 mg Per Tube Daily  . phenytoin (DILANTIN)  IV  100 mg Intravenous 3 times per day  . piperacillin-tazobactam (ZOSYN)  IV  3.375 g Intravenous 3 times per day  . sodium chloride  10-40 mL Intracatheter Q12H   Cardiac cath 03/05/14:  Left mainstem: There is diffuse calcification with 40% ostial left main disease. The left main tapers distally to 30%.  Left anterior descending (LAD): The LAD is a large vessel that gives rise to a moderately large diagonal branch. In the proximal LAD there is an aneurysmal segment followed immediately by a severe bifurcation stenosis 1:1:1 in the LAD and first diagonal up to 95%.  There is a moderately large ramus branch without significant disease.  Left circumflex (LCx): The LCx has mild ostial disease but otherwise has no significant stenosis.  Right coronary artery (RCA): Dominant vessel with 70% proximal stenosis. The crux of the RCA is severely diseased up to 90%. The Distal RCA is occluded with right to right and left to right collaterals.  Left ventriculography: Left ventricular systolic function is abnormal, LV size is increased with extensive inferior akinesis and global hypokinesis. LVEF is estimated at 25%, there is mild to moderate  mitral regurgitation   Echo 03/05/14: Left ventricle: The cavity size was moderately dilated. Systolic function was moderately to severely reduced. The estimated ejection fraction was in the range of 30% to 35%. There is akinesis of the entireinferior myocardium. There is akinesis of the basal-midinferolateral myocardium. - Aortic valve: Moderate thickening and calcification, consistent with sclerosis. There was trivial regurgitation. - Ascending aorta: The ascending aorta was mildly dilated. - Left atrium: The atrium was mildly dilated.  ASSESSMENT AND PLAN:   1. Ventricular fibrillation arrest, out of hospital: Witnessed arrest at home. Likely primary arrythmia event with ischemic cardiomyopathy. Bystander CPR. Admitted by CCM. Pt found to have high grade CAD with  complex disease involving the LAD/Diagonal bifurcation and severe disease in the RCA but these lesions were not acute. No PCI performed. S/p Cooling protocol per PCCM. Recurrent VT on 6/28. Shocked. Stopped amio due to bradycardia with seizure meds.  2. CAD: Severe double vessel disease by cath 03/05/14. His anatomy is not favorable for PCI. He would need consideration for CABG if he recovers neurological function.On ASA, b-blocker and statin. LVEF is 25% by LV gram. Echo with LVEF=30-35%.   Neurology reports that there is "Prognosis poor for functional recovery."  He is not a candidate for revascularization, either with PCI or CABG- unless his neuro status improves significantly.    3. Ischemic cardiomyopathy: As above. Will follow for now. No signs of ADHF. Continue beta blocker and ACEI.  4. Atrial fib/AFL: rate controlled. Continue metoprolol. Continue heparin.    5. Anoxic brain injury:  Per neurology    6. H/o CVA  7. Fever: antibiotics per CCM    Olga MillersBrian Chanc Kervin MD  7/4/20157:12 AM

## 2014-03-14 NOTE — Progress Notes (Signed)
PULMONARY / CRITICAL CARE MEDICINE   Name: Felicie MornJerry Cyphers MRN: [id number] DOB: 10/22/1940    ADMISSION DATE:  02/09/2014 CONSULTATION DATE:  02/11/2014  REFERRING MD :  EDP PRIMARY SERVICE: PCCM  CHIEF COMPLAINT:  Cardiac arrest  BRIEF PATIENT DESCRIPTION: 73 year old male with PMH MI, CVA, A-fib, and seizures suffered witnessed cardiac arrest 6/24.  Initial rhythm VF. ROSC achieved with defibrillation, CPR and amiodarone. In ED found to have ECG changes. PCCM asked to see for admission.   SIGNIFICANT EVENTS / STUDIES:   LINES / TUBES: R - port-a-cath (chronic) LIJ CVL 6/24 >>> ETT 6/24 >>>  CULTURES: Urine 6/24 >>> NEG MRSA PCR 6/24 >>> NEG resp c/s 7/1>>klebsiella BC x 2 7/1 >>NG Urine 7/1>>NEG    ANTIBIOTICS: Anti-infectives   Start     Dose/Rate Route Frequency Ordered Stop   03/11/14 1000  piperacillin-tazobactam (ZOSYN) IVPB 3.375 g     3.375 g 12.5 mL/hr over 240 Minutes Intravenous 3 times per day 03/11/14 0938        SUBJECTIVE:  Failed SBT, now with HCAP RLL, needs a TRACH  VITAL SIGNS: Temp:  [99 F (37.2 C)-100.3 F (37.9 C)] 99 F (37.2 C) (07/04 0700) Pulse Rate:  [65-87] 74 (07/04 0700) Resp:  [15-28] 18 (07/04 0700) BP: (99-150)/(46-82) 130/67 mmHg (07/04 0700) SpO2:  [95 %-99 %] 97 % (07/04 0726) FiO2 (%):  [30 %] 30 % (07/04 0726) Weight:  [92.1 kg (203 lb 0.7 oz)] 92.1 kg (203 lb 0.7 oz) (07/04 0458)  HEMODYNAMICS: CV stable    VENTILATOR SETTINGS: Vent Mode:  [-] PRVC FiO2 (%):  [30 %] 30 % Set Rate:  [14 bmp] 14 bmp Vt Set:  [550 mL] 550 mL PEEP:  [5 cmH20] 5 cmH20 Plateau Pressure:  [15 cmH20-26 cmH20] 21 cmH20  INTAKE / OUTPUT: Intake/Output     07/03 0701 - 07/04 0700 07/04 0701 - 07/05 0700   I.V. (mL/kg) 614 (6.7)    NG/GT 1700    IV Piggyback 150    Total Intake(mL/kg) 2464 (26.8)    Urine (mL/kg/hr) 1685 (0.8)    Total Output 1685     Net +779           PHYSICAL EXAMINATION: General:no distress Neuro: On keppra  and dilantin and off diprivan   Corneal +, gag and pupil +,will not f/c, will not track, will open eyes to voice, RASS -1, floppy, no arm or leg movement HEENT:  [place]/AT, pupils asymmetric with R cataract, ETT in place, plastic connector to vent hose pops off, now taped. This ETT was placed in the field Cardiovascular: IRIR, no M noted Lungs:  Rhonchi, secretins Abdomen:  Soft, non-distended. BS + Ext. Mild peripheral edema.  LABS:  PULMONARY No results found for this basename: PHART, PCO2, PCO2ART, PO2, PO2ART, HCO3, TCO2, O2SAT,  in the last 168 hours  CBC  Recent Labs Lab 03/12/14 0400 03/13/14 0305 03/14/14 0250  HGB 9.8* 10.0* 10.0*  HCT 29.5* 30.4* 30.3*  WBC 5.3 5.0 6.0  PLT 78* 88* 105*    COAGULATION  Recent Labs Lab 03/09/14 0443  INR 1.57*    CARDIAC   No results found for this basename: TROPONINI,  in the last 168 hours  Recent Labs Lab 03/12/14 0500  PROBNP 3803.0*     CHEMISTRY  Recent Labs Lab 03/08/14 0430 03/09/14 0443 03/10/14 0615 03/12/14 0400 03/13/14 0305 03/14/14 0250  NA 138 140 142 142 140 140  K 3.8 4.1 4.4 4.4 4.6  4.6  CL 104 104 106 105 103 104  CO2 24 24 26 27 24 21   GLUCOSE 148* 150* 156* 142* 133* 172*  BUN 25* 23 21 32* 34* 37*  CREATININE 0.85 0.82 0.77 0.74 0.71 0.71  CALCIUM 8.4 8.3* 8.0* 8.2* 8.4 8.6  MG 2.0 2.2 1.9 2.1 2.1  --   PHOS 1.9* 3.4 2.6 3.0 3.3  --    Estimated Creatinine Clearance: 87.6 ml/min (by C-G formula based on Cr of 0.71).   LIVER  Recent Labs Lab 03/09/14 0443  INR 1.57*     INFECTIOUS  Recent Labs Lab 03/07/14 0807 03/11/14 0904 03/12/14 0400 03/13/14 0305  LATICACIDVEN 1.1  --   --   --   PROCALCITON  --  0.10 0.10 <0.10     ENDOCRINE CBG (last 3)   Recent Labs  03/13/14 2046 03/14/14 0058 03/14/14 0502  GLUCAP 136* 129* 128*    All imaging reviewed prior 48hrs. Cxr: progressive RLL infiltrate 03/14/14    ASSESSMENT / PLAN: Principal Problem:   Cardiac  arrest Active Problems:   Seizure   Acute respiratory failure   Coronary atherosclerosis of native coronary artery   Cardiomyopathy, ischemic   Atrial fibrillation   Anoxic brain damage   COPD (chronic obstructive pulmonary disease) with chronic bronchitis   HCAP (healthcare-associated pneumonia) due to Klebsiella   PULMONARY A: Acute respiratory failure 2nd to cardiac arrest H/o COPD Lung nodule on CT Klebsiella HCAP, bilateral R>L Lower lobe infiltrates worse 7/4  - does not meet sbt/extubation criteria due to mental status on 03/14/14  NEEDS TRACH P:   Cont full vent support -  Cont vent bundle Daily SBT if/when meets criteria BDs NEEDS TRACH  CARDIOVASCULAR A:  VF arrest  Hypertension Atrial fib Critical CAD  P:   MAP goal at 65 mmHg Cards following Consider CABG if neuro recovery good  RENAL A:   normal  P:   Monitor  Correct electrolytes as indicated  GASTROINTESTINAL A:   No issues P:   SUP:per tube PPI TFs to cont  HEMATOLOGIC A:   Chronic warfarin therapy for CAF Thrombocytopenia of uncertain etiology but no change on IV heparin P:  DVT px: SCDs Monitor CBC intermittently Transfuse per usual ICU guidelines Holding warfarin but on IV heparin gtt per cards  INFECTIOUS A:  Klebsiella HCAP R and L Lower lobes as fever source P:   Cont zosyn   ENDOCRINE A:    Hyperglycemia without prior DM P:   Cont SSI  NEUROLOGIC A:   Acute encephalopathy, post anoxic  H/o seizures, CVA No further seizures since 03/11/14   P:   Hold Diprivan  To extent possible PRN fentanyl only RASS goal -1   TODAYS SUMMARY S/p Vfib arrest and severe multivessel CAD and ischemic LV. Seizure hx and anoxic enceph s/p cooling.  Now Vent dep d/t neuro issues. Now Hcap Klebsiella  Needs a trach. Eventually needs CABG if ever improves neuro  The patient is critically ill with multiple organ systems failure and requires high complexity decision making for  assessment and support, frequent evaluation and titration of therapies, application of advanced monitoring technologies and extensive interpretation of multiple databases.   Critical Care Time devoted to patient care services described in this note is  35  Minutes.  Dorcas Carrowatrick WrightMD Beeper  [phone]  Cell  [phone]  If no response or cell goes to voicemail, call beeper [phone]   03/14/2014 7:58 AM

## 2014-03-15 ENCOUNTER — Inpatient Hospital Stay (HOSPITAL_COMMUNITY): Payer: Medicare Other

## 2014-03-15 LAB — CBC
HCT: 29.2 % — ABNORMAL LOW (ref 39.0–52.0)
Hemoglobin: 9.6 g/dL — ABNORMAL LOW (ref 13.0–17.0)
MCH: 32.7 pg (ref 26.0–34.0)
MCHC: 32.9 g/dL (ref 30.0–36.0)
MCV: 99.3 fL (ref 78.0–100.0)
Platelets: 111 10*3/uL — ABNORMAL LOW (ref 150–400)
RBC: 2.94 MIL/uL — ABNORMAL LOW (ref 4.22–5.81)
RDW: 14.9 % (ref 11.5–15.5)
WBC: 6.7 10*3/uL (ref 4.0–10.5)

## 2014-03-15 LAB — BASIC METABOLIC PANEL
Anion gap: 14 (ref 5–15)
BUN: 37 mg/dL — ABNORMAL HIGH (ref 6–23)
CALCIUM: 8.5 mg/dL (ref 8.4–10.5)
CHLORIDE: 105 meq/L (ref 96–112)
CO2: 22 mEq/L (ref 19–32)
CREATININE: 0.73 mg/dL (ref 0.50–1.35)
GFR calc non Af Amer: 89 mL/min — ABNORMAL LOW (ref >90)
Glucose, Bld: 108 mg/dL — ABNORMAL HIGH (ref 70–99)
Potassium: 4.7 mEq/L (ref 3.7–5.3)
Sodium: 141 mEq/L (ref 137–147)

## 2014-03-15 LAB — GLUCOSE, CAPILLARY
GLUCOSE-CAPILLARY: 108 mg/dL — AB (ref 70–99)
GLUCOSE-CAPILLARY: 108 mg/dL — AB (ref 70–99)
Glucose-Capillary: 102 mg/dL — ABNORMAL HIGH (ref 70–99)
Glucose-Capillary: 112 mg/dL — ABNORMAL HIGH (ref 70–99)

## 2014-03-15 LAB — HEPARIN LEVEL (UNFRACTIONATED): Heparin Unfractionated: 0.42 IU/mL (ref 0.30–0.70)

## 2014-03-15 MED ORDER — ACETAMINOPHEN 160 MG/5ML PO SOLN
650.0000 mg | Freq: Four times a day (QID) | ORAL | Status: DC | PRN
  Administered 2014-03-15: 650 mg
  Filled 2014-03-15: qty 20.3

## 2014-03-15 MED ORDER — FUROSEMIDE 10 MG/ML IJ SOLN
40.0000 mg | Freq: Every day | INTRAMUSCULAR | Status: DC
  Administered 2014-03-15: 40 mg via INTRAVENOUS
  Filled 2014-03-15 (×2): qty 4

## 2014-03-15 NOTE — Progress Notes (Signed)
ANTICOAGULATION CONSULT NOTE - Follow Up Consult  Pharmacy Consult for Heparin Indication: atrial fibrillation   No Known Allergies  Patient Measurements: Height: 6' (182.9 cm) Weight: 203 lb 0.7 oz (92.1 kg) IBW/kg (Calculated) : 77.6 Heparin Dosing Weight: 92kg  Vital Signs: Temp: 99.9 F (37.7 C) (07/05 0335) Temp src: Oral (07/05 0335) BP: 104/53 mmHg (07/05 0600) Pulse Rate: 67 (07/05 0600)  Labs:  Recent Labs  03/13/14 0305 03/14/14 0250 03/15/14 0220  HGB 10.0* 10.0* 9.6*  HCT 30.4* 30.3* 29.2*  PLT 88* 105* 111*  HEPARINUNFRC 0.41 0.35 0.42  CREATININE 0.71 0.71 0.73    Estimated Creatinine Clearance: 87.6 ml/min (by C-G formula based on Cr of 0.73).   Medications:  Heparin @ 1850 units/hr  Assessment: 73yom s/p cardiac arrest/hypothermia continues on heparin for afib. Heparin level is therapeutic. Hgb/Hct are low but stable, platelets low but trending up. No bleeding reported.   Goal of Therapy:  Heparin level 0.3-0.7 units/ml Monitor platelets by anticoagulation protocol: Yes   Plan:  1) Continue heparin at 1850 units/hr 2) Follow up heparin level, CBC in AM  Fredrik RiggerMarkle, Jabbar Palmero Sue 03/15/2014,9:24 AM

## 2014-03-15 NOTE — Progress Notes (Signed)
SUBJECTIVE:  73 y/o male with previous CVA admitted witnessed OOH arrest requiring CPR and cooling. 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 His anatomy is not favorable for PCI. He will need consideration for CABG if he recovers neurological function. LVEF is 25% by LV gram. Echo with LVEF=30-35%.   Remains Intubated. Opens eyes but no purposeful response.   VT arrest on 6/28.  Shocked with 150J and quickly returned to his baseline AF.      Tele: afib with controlled ventricular response  BP 104/53  Pulse 67  Temp(Src) 99.9 F (37.7 C) (Oral)  Resp 14  Ht 6' (1.829 m)  Wt 203 lb 0.7 oz (92.1 kg)  BMI 27.53 kg/m2  SpO2 99%  Intake/Output Summary (Last 24 hours) at 03/15/14 16100712 Last data filed at 03/15/14 0600  Gross per 24 hour  Intake 1880.4 ml  Output   2100 ml  Net -219.6 ml    PHYSICAL EXAM General: Intubated. Opens eyes, no purposeful movements Neck: No JVD.  Lungs: CTA Heart: Irregular  Abdomen: Soft, not distended Extremities: 1+ edema  LABS: Basic Metabolic Panel:  Recent Labs  96/12/5405/03/15 0305 03/14/14 0250 03/15/14 0220  NA 140 140 141  K 4.6 4.6 4.7  CL 103 104 105  CO2 24 21 22   GLUCOSE 133* 172* 108*  BUN 34* 37* 37*  CREATININE 0.71 0.71 0.73  CALCIUM 8.4 8.6 8.5  MG 2.1  --   --   PHOS 3.3  --   --    CBC:  Recent Labs  03/13/14 0305 03/14/14 0250 03/15/14 0220  WBC 5.0 6.0 6.7  NEUTROABS 4.1 5.2  --   HGB 10.0* 10.0* 9.6*  HCT 30.4* 30.3* 29.2*  MCV 98.1 97.7 99.3  PLT 88* 105* 111*  Current Meds: . antiseptic oral rinse  15 mL Mouth Rinse QID  . aspirin  81 mg Per Tube Daily  . atorvastatin  80 mg Per NG tube q1800  . captopril  6.25 mg Per NG tube TID  . chlorhexidine  15 mL Mouth Rinse BID  . etomidate      . fentaNYL  50 mcg Intravenous Once  . insulin aspart  0-15 Units Subcutaneous 6 times per day  . ipratropium-albuterol  3 mL Nebulization Q6H  . levETIRAcetam  1,000 mg Per Tube BID  . lidocaine  (cardiac) 100 mg/85ml      . metoprolol tartrate  12.5 mg Per Tube BID  . midazolam      . pantoprazole sodium  40 mg Per Tube Daily  . phenytoin (DILANTIN) IV  100 mg Intravenous 3 times per day  . piperacillin-tazobactam (ZOSYN)  IV  3.375 g Intravenous 3 times per day  . sodium chloride  10-40 mL Intracatheter Q12H  . succinylcholine       Cardiac cath 03/05/14:  Left mainstem: There is diffuse calcification with 40% ostial left main disease. The left main tapers distally to 30%.  Left anterior descending (LAD): The LAD is a large vessel that gives rise to a moderately large diagonal branch. In the proximal LAD there is an aneurysmal segment followed immediately by a severe bifurcation stenosis 1:1:1 in the LAD and first diagonal up to 95%.  There is a moderately large ramus branch without significant disease.  Left circumflex (LCx): The LCx has mild ostial disease but otherwise has no significant stenosis.  Right coronary artery (RCA): Dominant vessel with 70% proximal stenosis. The crux of the RCA is severely diseased  up to 90%. The Distal RCA is occluded with right to right and left to right collaterals.  Left ventriculography: Left ventricular systolic function is abnormal, LV size is increased with extensive inferior akinesis and global hypokinesis. LVEF is estimated at 25%, there is mild to moderate mitral regurgitation   Echo 03/05/14: Left ventricle: The cavity size was moderately dilated. Systolic function was moderately to severely reduced. The estimated ejection fraction was in the range of 30% to 35%. There is akinesis of the entireinferior myocardium. There is akinesis of the basal-midinferolateral myocardium. - Aortic valve: Moderate thickening and calcification, consistent with sclerosis. There was trivial regurgitation. - Ascending aorta: The ascending aorta was mildly dilated. - Left atrium: The atrium was mildly dilated.  ASSESSMENT AND PLAN:   1. Ventricular  fibrillation arrest, out of hospital: Witnessed arrest at home. Likely primary arrythmia event with ischemic cardiomyopathy. Bystander CPR. Admitted by CCM. Pt found to have high grade CAD with complex disease involving the LAD/Diagonal bifurcation and severe disease in the RCA but these lesions were not acute. No PCI performed. S/p Cooling protocol per PCCM. Recurrent VT on 6/28. Shocked. Stopped amio due to bradycardia with seizure meds.  2. CAD: Severe double vessel disease by cath 03/05/14. His anatomy is not favorable for PCI. He would need consideration for CABG if he recovers neurological function.On ASA, b-blocker and statin. LVEF is 25% by LV gram. Echo with LVEF=30-35%.   Neurology reports that there is "Prognosis poor for functional recovery."  He is not a candidate for revascularization, either with PCI or CABG- unless his neuro status improves significantly.    3. Ischemic cardiomyopathy: As above. Will follow for now. No signs of ADHF. Continue beta blocker and ACEI. Volume overloaded on exam; begin lasix 40 mg IV daily; follow renal function.  4. Atrial fib/AFL: rate controlled. Continue metoprolol. Continue heparin.    5. Anoxic brain injury:  Per neurology    6. H/o CVA  7. Fever: antibiotics per CCM    Olga MillersBrian Blakley Michna MD  7/5/20157:12 AM

## 2014-03-15 NOTE — Progress Notes (Signed)
PULMONARY / CRITICAL CARE MEDICINE   Name: Kyle [name] MRN: [id number] DOB: 03/24/1941    ADMISSION DATE:  03/10/2014 CONSULTATION DATE:  02/25/2014  REFERRING MD :  EDP PRIMARY SERVICE: PCCM  CHIEF COMPLAINT:  Cardiac arrest  BRIEF PATIENT DESCRIPTION: [age] year old male with PMH MI, CVA, A-fib, and seizures suffered witnessed cardiac arrest 6/24.  Initial rhythm VF. ROSC achieved with defibrillation, CPR and amiodarone. In ED found to have ECG changes. PCCM asked to see for admission.   SIGNIFICANT EVENTS / STUDIES:   LINES / TUBES: R - port-a-cath (chronic) [facility] CVL 6/24 >>> ETT 6/24 >>>  CULTURES: Urine 6/24 >>> NEG MRSA PCR 6/24 >>> NEG resp c/s 7/1>>klebsiella BC x 2 7/1 >>NG Urine 7/1>>NEG    ANTIBIOTICS: Anti-infectives   Start     Dose/Rate Route Frequency Ordered Stop   03/11/14 1000  piperacillin-tazobactam (ZOSYN) IVPB 3.375 g     3.375 g 12.5 mL/hr over 240 Minutes Intravenous 3 times per day 03/11/14 0938        SUBJECTIVE:  Needs TRACH or one way extubation  VITAL SIGNS: Temp:  [97.9 F (36.6 C)-100.2 F (37.9 C)] 99.9 F (37.7 C) (07/05 0335) Pulse Rate:  [66-92] 67 (07/05 0600) Resp:  [14-27] 14 (07/05 0600) BP: (94-148)/(49-79) 104/53 mmHg (07/05 0600) SpO2:  [94 %-99 %] 99 % (07/05 0600) FiO2 (%):  [30 %] 30 % (07/05 0409) Weight:  [92.1 kg (203 lb 0.7 oz)] 92.1 kg (203 lb 0.7 oz) (07/05 0335)  HEMODYNAMICS: CV stable    VENTILATOR SETTINGS: Vent Mode:  [-] PRVC FiO2 (%):  [30 %] 30 % Set Rate:  [14 bmp] 14 bmp Vt Set:  [550 mL] 550 mL PEEP:  [5 cmH20] 5 cmH20 Plateau Pressure:  [14 cmH20-24 cmH20] 15 cmH20  INTAKE / OUTPUT: Intake/Output     07/04 0701 - 07/05 0700 07/05 0701 - 07/06 0700   I.V. (mL/kg) 790.4 (8.6)    NG/GT 940    IV Piggyback 150    Total Intake(mL/kg) 1880.4 (20.4)    Urine (mL/kg/hr) 2100 (1)    Total Output 2100     Net -219.6           PHYSICAL EXAMINATION: General:no distress Neuro: On keppra and  dilantin  Back on fent/diprivan, jerked head overnight to extubation RASS -3 now   Corneal +, gag and pupil +,will not f/c, will not track, floppy, no arm or leg movement HEENT:  [place]/AT, pupils asymmetric with R cataract, New ETT in oral position and secure Cardiovascular: IRIR, no M noted Lungs:  Rhonchi, secretins Abdomen:  Soft, non-distended. BS + Ext. Mild peripheral edema.  LABS:  PULMONARY No results found for this basename: PHART, PCO2, PCO2ART, PO2, PO2ART, HCO3, TCO2, O2SAT,  in the last 168 hours  CBC  Recent Labs Lab 03/13/14 0305 03/14/14 0250 03/15/14 0220  HGB 10.0* 10.0* 9.6*  HCT 30.4* 30.3* 29.2*  WBC 5.0 6.0 6.7  PLT 88* 105* 111*    COAGULATION  Recent Labs Lab 03/09/14 0443  INR 1.57*    CARDIAC   No results found for this basename: TROPONINI,  in the last 168 hours  Recent Labs Lab 03/12/14 0500  PROBNP 3803.0*     CHEMISTRY  Recent Labs Lab 03/09/14 0443 03/10/14 0615 03/12/14 0400 03/13/14 0305 03/14/14 0250 03/15/14 0220  NA 140 142 142 140 140 141  K 4.1 4.4 4.4 4.6 4.6 4.7  CL 104 106 105 103 104 105  CO2 24 26  27 24 21 22   GLUCOSE 150* 156* 142* 133* 172* 108*  BUN 23 21 32* 34* 37* 37*  CREATININE 0.82 0.77 0.74 0.71 0.71 0.73  CALCIUM 8.3* 8.0* 8.2* 8.4 8.6 8.5  MG 2.2 1.9 2.1 2.1  --   --   PHOS 3.4 2.6 3.0 3.3  --   --    Estimated Creatinine Clearance: 87.6 ml/min (by C-G formula based on Cr of 0.73).   LIVER  Recent Labs Lab 03/09/14 0443  INR 1.57*     INFECTIOUS  Recent Labs Lab 03/11/14 0904 03/12/14 0400 03/13/14 0305  PROCALCITON 0.10 0.10 <0.10     ENDOCRINE CBG (last 3)   Recent Labs  03/14/14 1931 03/14/14 2334 03/15/14 0331  GLUCAP 115* 116* 102*    All imaging reviewed prior 48hrs. Cxr: progressive RLL infiltrate 03/14/14    ASSESSMENT / PLAN: Principal Problem:   Cardiac arrest Active Problems:   Seizure   Acute respiratory failure   Coronary atherosclerosis of  native coronary artery   Cardiomyopathy, ischemic   Atrial fibrillation   Anoxic brain damage   COPD (chronic obstructive pulmonary disease) with chronic bronchitis   HCAP (healthcare-associated pneumonia) due to Klebsiella   PULMONARY A: Acute respiratory failure 2nd to cardiac arrest H/o COPD Lung nodule on CT Klebsiella HCAP, bilateral R>L Lower lobe infiltrates worse 7/4  - does not meet sbt/extubation criteria due to mental status on 03/14/14  NEEDS TRACH, self extubation due to head jerking PM 03/14/14 P:   Cont full vent support -  Cont vent bundle Daily SBT if/when meets criteria BDs NEEDS TRACH or one way extubation  CARDIOVASCULAR A:  VF arrest  Hypertension Atrial fib Critical CAD  P:   MAP goal at 65 mmHg Cards following Consider CABG if neuro recovery good  RENAL A:   normal  P:   Monitor  Correct electrolytes as indicated  GASTROINTESTINAL A:   No issues P:   SUP:per tube PPI TFs to cont  HEMATOLOGIC A:   Chronic warfarin therapy for CAF Thrombocytopenia of uncertain etiology but no change on IV heparin P:  DVT px: SCDs Monitor CBC intermittently Transfuse per usual ICU guidelines Holding warfarin but on IV heparin gtt per cards  INFECTIOUS A:  Klebsiella HCAP R and L Lower lobes as fever source P:   Cont zosyn   ENDOCRINE A:    Hyperglycemia without prior DM P:   Cont SSI  NEUROLOGIC A:   Acute encephalopathy, post anoxic  H/o seizures, CVA No further seizures since 03/11/14   P:   Hold Diprivan  To extent possible Fentanyl qtt only RASS goal -1 to 0  Family updated 7/4 via elink camera.  Family to return this pm for further updates  TODAYS SUMMARY S/p Vfib arrest and severe multivessel CAD and ischemic LV. Seizure hx and anoxic enceph s/p cooling.  Now Vent dep d/t neuro issues. Now Hcap Klebsiella  Needs a trach or one way extubation. Eventually needs CABG if ever improves neuro  The patient is critically ill with  multiple organ systems failure and requires high complexity decision making for assessment and support, frequent evaluation and titration of therapies, application of advanced monitoring technologies and extensive interpretation of multiple databases.   Critical Care Time devoted to patient care services described in this note is  35  Minutes.  Dorcas Carrowatrick WrightMD Beeper  [phone]  Cell  [phone]  If no response or cell goes to voicemail, call beeper [phone]   03/15/2014 7:50  AM

## 2014-03-15 NOTE — Progress Notes (Addendum)
Subjective: Patient was noted by nursing to lift his arms with stimulation.  Otherwise examination unchanged.    Objective: Current vital signs: BP 119/62  Pulse 81  Temp(Src) 101.9 F (38.8 C) (Oral)  Resp 16  Ht 6' (1.829 m)  Wt 92.1 kg (203 lb 0.7 oz)  BMI 27.53 kg/m2  SpO2 99% Vital signs in last 24 hours: Temp:  [97.9 F (36.6 C)-101.9 F (38.8 C)] 101.9 F (38.8 C) (07/05 0800) Pulse Rate:  [66-92] 81 (07/05 0700) Resp:  [14-27] 16 (07/05 0700) BP: (94-144)/(49-79) 119/62 mmHg (07/05 0700) SpO2:  [94 %-99 %] 99 % (07/05 0756) FiO2 (%):  [30 %] 30 % (07/05 0757) Weight:  [92.1 kg (203 lb 0.7 oz)] 92.1 kg (203 lb 0.7 oz) (07/05 0335)  Intake/Output from previous day: 07/04 0701 - 07/05 0700 In: 1880.4 [I.V.:790.4; NG/GT:940; IV Piggyback:150] Out: 2100 [Urine:2100] Intake/Output this shift: Total I/O In: -  Out: 250 [Urine:250] Nutritional status: NPO  Neurologic Exam: Mental Status:  Opens eyes when name called and seems to focus on examiner and follow around the room. Does not follow commands. No verbalizations noted.  Cranial Nerves:  II: Blinks to confrontation bilaterally, pupils right clouded and left 3 mm,and reactive bilaterally  III,IV,VI: doll's response present bilaterally.  V,VII: corneal reflexes intact  VIII: grossly intact  IX,X: gag reflex reduced, XI: trapezius strength unable to test bilaterally  XII: tongue strength unable to test  Motor:  Extremities flaccid throughout. No spontaneous movement noted. No purposeful movements noted.  Sensory:  Does not respond to noxious stimuli in any extremity.  Deep Tendon Reflexes:  1+ with absent AJ's bilaterally  Plantars:  upgoing bilaterally  Cerebellar:  Unable to perform   Lab Results: Basic Metabolic Panel:  Recent Labs Lab 03/09/14 0443 03/10/14 0615 03/12/14 0400 03/13/14 0305 03/14/14 0250 03/15/14 0220  NA 140 142 142 140 140 141  K 4.1 4.4 4.4 4.6 4.6 4.7  CL 104 106 105 103  104 105  CO2 24 26 27 24 21 22   GLUCOSE 150* 156* 142* 133* 172* 108*  BUN 23 21 32* 34* 37* 37*  CREATININE 0.82 0.77 0.74 0.71 0.71 0.73  CALCIUM 8.3* 8.0* 8.2* 8.4 8.6 8.5  MG 2.2 1.9 2.1 2.1  --   --   PHOS 3.4 2.6 3.0 3.3  --   --     Liver Function Tests: No results found for this basename: AST, ALT, ALKPHOS, BILITOT, PROT, ALBUMIN,  in the last 168 hours No results found for this basename: LIPASE, AMYLASE,  in the last 168 hours No results found for this basename: AMMONIA,  in the last 168 hours  CBC:  Recent Labs Lab 03/09/14 0443 03/10/14 0615 03/12/14 0400 03/13/14 0305 03/14/14 0250 03/15/14 0220  WBC 8.2 4.7 5.3 5.0 6.0 6.7  NEUTROABS 6.1 3.6 4.5 4.1 5.2  --   HGB 12.3* 11.2* 9.8* 10.0* 10.0* 9.6*  HCT 36.3* 33.3* 29.5* 30.4* 30.3* 29.2*  MCV 96.0 96.8 97.4 98.1 97.7 99.3  PLT 93* 66* 78* 88* 105* 111*    Cardiac Enzymes: No results found for this basename: CKTOTAL, CKMB, CKMBINDEX, TROPONINI,  in the last 168 hours  Lipid Panel:  Recent Labs Lab 03/10/14 0615 03/10/14 2320 03/14/14 2056  TRIG 60 86 83    CBG:  Recent Labs Lab 03/14/14 1209 03/14/14 1636 03/14/14 1931 03/14/14 2334 03/15/14 0331  GLUCAP 162* 131* 115* 116* 102*    Microbiology: Results for orders placed during the hospital encounter  of 02/18/2014  URINE CULTURE     Status: None   Collection Time    03/09/2014  7:55 PM      Result Value Ref Range Status   Specimen Description URINE, CATHETERIZED   Final   Special Requests NONE   Final   Culture  Setup Time     Final   Value: 03/05/2014 01:10     Performed at [facility]   Colony Count     Final   Value: NO GROWTH     [facility]formed [facility]   Culture     Final   Value: NO GROWTH     Performed at [facility]   Report Status 03/05/2014 FINAL   Final  MRSA PCR SCREENING     Status: None   Collection Time    03/05/14 12:31 AM      Result Value Ref Range Status   MRSA by PCR NEGATIVE   NEGATIVE Final   Comment:            The GeneXpert MRSA Assay (FDA     approved for NASAL specimens     only), is one component of a     comprehensive MRSA colonization     surveillance program. It is not     intended to diagnose MRSA     infection nor to guide or     monitor treatment for     MRSA infections.  CULTURE, RESPIRATORY (NON-EXPECTORATED)     Status: None   Collection Time    03/11/14  9:54 AM      Result Value Ref Range Status   Specimen Description TRACHEAL ASPIRATE   Final   Special Requests NONE   Final   Gram Stain     Final   Value: MODERATE WBC PRESENT,BOTH PMN AND MONONUCLEAR     RARE SQUAMOUS EPITHELIAL CELLS PRESENT     FEW GRAM POSITIVE COCCI IN PAIRS     IN CLUSTERS FEW GRAM POSITIVE RODS     RARE GRAM NEGATIVE RODS   Culture     Final   Value: FEW KLEBSIELLA SPECIES     Performed at [facility]   Report Status 03/13/2014 FINAL   Final   Organism ID, Bacteria KLEBSIELLA SPECIES   Final  URINE CULTURE     Status: None   Collection Time    03/11/14  9:54 AM      Result Value Ref Range Status   Specimen Description URINE, CLEAN CATCH   Final   Special Requests NONE   Final   Culture  Setup Time     Final   Value: 03/11/2014 14:19     Performed at [facility]   Colony Count     Final   Value: NO GROWTH     Performed at [facility]   Culture     Final   Value: NO GROWTH     Performed at [facility]   Report Status 03/12/2014 FINAL   Final  CULTURE, BLOOD (ROUTINE X 2)     Status: None   Collection Time    03/11/14 11:10 AM      Result Value Ref Range Status   Specimen Description BLOOD LEFT ARM   Final   Special Requests BOTTLES DRAWN AEROBIC ONLY 7.5CC   Final   Culture  Setup Time     Final   Value: 03/11/2014 16:59     Performed at [facility]   Culture     Final   Value:        BLOOD CULTURE RECEIVED NO GROWTH TO DATE CULTURE WILL BE HELD FOR 5 DAYS BEFORE ISSUING A FINAL NEGATIVE REPORT      Performed at [facility]   Report Status PENDING   Incomplete  CULTURE, BLOOD (ROUTINE X 2)     Status: None   Collection Time    03/11/14 11:15 AM      Result Value Ref Range Status   Specimen Description BLOOD LEFT HAND   Final   Special Requests BOTTLES DRAWN AEROBIC AND ANAEROBIC 10CC   Final   Culture  Setup Time     Final   Value: 03/11/2014 16:59     Performed at [facility]   Culture     Final   Value:        BLOOD CULTURE RECEIVED NO GROWTH TO DATE CULTURE WILL BE HELD FOR 5 DAYS BEFORE ISSUING A FINAL NEGATIVE REPORT     Performed at [facility]   Report Status PENDING   Incomplete    Coagulation Studies: No results found for this basename: LABPROT, INR,  in the last 72 hours  Imaging: Dg Chest Port 1 View  03/15/2014   CLINICAL DATA:  Respiratory failure.  EXAM: PORTABLE CHEST - 1 VIEW  COMPARISON:  One-view chest 03/14/2014.  FINDINGS: The endotracheal tube terminates 4.9 cm above the chronic, in satisfactory position. A right IJ Port-A-Cath is stable in position. The tip is greater than 2 vertebral bodies below the carina and may be within the right atrium, unchanged. A left IJ line is stable in position. The heart is enlarged. A right pleural effusion is present. The left CP angle is not visualized. Right lower lobe airspace disease is noted. Mild edema is present.  IMPRESSION: 1. Persistent right pleural effusion and basilar airspace disease. While this likely represents atelectasis, infection is not excluded. 2. Increasing mild edema. 3. The support apparatus is stable.   Electronically Signed   By: Gennette Pac M.D.   On: 03/15/2014 08:31   Dg Chest Port 1 View  03/14/2014   CLINICAL DATA:  Post cardiac arrest.  Endotracheal tube replacement.  EXAM: PORTABLE CHEST - 1 VIEW 1953 hr:  COMPARISON:  Portable chest x-ray earlier same date 0517 hr and dating back to 03/10/2014.  FINDINGS: Endotracheal tube tip in satisfactory position approximately 7 cm  above the carina. Left jugular central venous catheter tip projects at the junction of the innominate vein and SVC, unchanged. NG tube tip courses below the diaphragm into the stomach. Right jugular Port-A-Cath tip projects over the lower SVC. No evidence of pneumothorax or pneumomediastinum.  Cardiac silhouette enlarged but stable. Pulmonary vascularity normal. Dense atelectasis in the lower lobes, worse than earlier today.  IMPRESSION: 1. Endotracheal tube tip in satisfactory position projecting approximately 7 cm above the carina. 2. Remaining support apparatus satisfactory. 3. Worsening atelectasis in the lower lobes since earlier today. 4. Stable cardiomegaly without pulmonary edema.   Electronically Signed   By: Hulan Saas M.D.   On: 03/14/2014 20:14   Dg Chest Port 1 View  03/14/2014   CLINICAL DATA:  Respiratory failure.  Intubated.  EXAM: PORTABLE CHEST - 1 VIEW  COMPARISON:  Yesterday.  FINDINGS: Stable enlarged cardiac silhouette. Increased patchy opacity at both lung bases. Probable small right pleural effusion. Stable enlarged cardiac silhouette. Nasogastric tube extending into the stomach. The left jugular catheter  is unchanged and the right jugular porta catheter tip is in the upper right atrium.  IMPRESSION: 1. Increased bibasilar atelectasis or pneumonia. 2. Probable small right pleural effusion. 3. Stable cardiomegaly.   Electronically Signed   By: Gordan Payment M.D.   On: 03/14/2014 07:19   Dg Abd Portable 1v  03/14/2014   CLINICAL DATA:  OG tube placement at bedside.  EXAM: PORTABLE ABDOMEN - 1 VIEW  COMPARISON:  None.  FINDINGS: OG tube tip in the proximal body of the stomach. Only the extreme upper abdomen was included on the image, and the visualized bowel gas pattern is unremarkable.  IMPRESSION: OG tube tip in the proximal body of the stomach.   Electronically Signed   By: Hulan Saas M.D.   On: 03/14/2014 20:15    Medications:  I have reviewed the patient's current  medications. Scheduled: . antiseptic oral rinse  15 mL Mouth Rinse QID  . aspirin  81 mg Per Tube Daily  . atorvastatin  80 mg Per NG tube q1800  . captopril  6.25 mg Per NG tube TID  . chlorhexidine  15 mL Mouth Rinse BID  . fentaNYL  50 mcg Intravenous Once  . furosemide  40 mg Intravenous Daily  . insulin aspart  0-15 Units Subcutaneous 6 times per day  . ipratropium-albuterol  3 mL Nebulization Q6H  . levETIRAcetam  1,000 mg Per Tube BID  . metoprolol tartrate  12.5 mg Per Tube BID  . pantoprazole sodium  40 mg Per Tube Daily  . phenytoin (DILANTIN) IV  100 mg Intravenous 3 times per day  . piperacillin-tazobactam (ZOSYN)  IV  3.375 g Intravenous 3 times per day  . sodium chloride  10-40 mL Intracatheter Q12H    Assessment/Plan: Patient unchanged per my examination but may be starting to have more of a response per nursing.  Continues to open eyes and seems to fix and follow examines around the room.  No seizure activity noted.  On Dilantin and Keppra. Based on current neurological examination and radiological findings it remains difficult to provide a prognosis for this patient.  The longer he has no improvement, the less likely it is that he will significantly improve.  May be starting to show some modest improvement at this time.  Would give more time.  Unfortunately I suspect that if he improves his course will be long and protracted.  Unclear if his cardiac status will support this.    Case discussed with Dr. Delford Field.     LOS: 11 days   Thana Farr, MD [facility] [phone] 03/15/2014  12:06 PM

## 2014-03-16 ENCOUNTER — Inpatient Hospital Stay (HOSPITAL_COMMUNITY): Payer: Medicare Other

## 2014-03-16 LAB — CBC
HEMATOCRIT: 27.8 % — AB (ref 39.0–52.0)
Hemoglobin: 9.2 g/dL — ABNORMAL LOW (ref 13.0–17.0)
MCH: 32.4 pg (ref 26.0–34.0)
MCHC: 33.1 g/dL (ref 30.0–36.0)
MCV: 97.9 fL (ref 78.0–100.0)
Platelets: 141 10*3/uL — ABNORMAL LOW (ref 150–400)
RBC: 2.84 MIL/uL — AB (ref 4.22–5.81)
RDW: 14.4 % (ref 11.5–15.5)
WBC: 5.5 10*3/uL (ref 4.0–10.5)

## 2014-03-16 LAB — BASIC METABOLIC PANEL
Anion gap: 10 (ref 5–15)
BUN: 36 mg/dL — AB (ref 6–23)
CHLORIDE: 103 meq/L (ref 96–112)
CO2: 27 meq/L (ref 19–32)
CREATININE: 0.72 mg/dL (ref 0.50–1.35)
Calcium: 8.6 mg/dL (ref 8.4–10.5)
GFR calc Af Amer: >90 mL/min (ref >90)
GFR calc non Af Amer: 89 mL/min — ABNORMAL LOW (ref >90)
Glucose, Bld: 117 mg/dL — ABNORMAL HIGH (ref 70–99)
POTASSIUM: 4 meq/L (ref 3.7–5.3)
Sodium: 140 mEq/L (ref 137–147)

## 2014-03-16 LAB — PROTIME-INR
INR: 1.32 (ref 0.00–1.49)
Prothrombin Time: 16.4 seconds — ABNORMAL HIGH (ref 11.6–15.2)

## 2014-03-16 LAB — GLUCOSE, CAPILLARY
GLUCOSE-CAPILLARY: 118 mg/dL — AB (ref 70–99)
GLUCOSE-CAPILLARY: 155 mg/dL — AB (ref 70–99)
Glucose-Capillary: 132 mg/dL — ABNORMAL HIGH (ref 70–99)
Glucose-Capillary: 125 mg/dL — ABNORMAL HIGH (ref 70–99)
Glucose-Capillary: 141 mg/dL — ABNORMAL HIGH (ref 70–99)
Glucose-Capillary: 127 mg/dL — ABNORMAL HIGH (ref 70–99)

## 2014-03-16 LAB — HEPARIN LEVEL (UNFRACTIONATED): Heparin Unfractionated: 0.63 IU/mL (ref 0.30–0.70)

## 2014-03-16 LAB — APTT: aPTT: 131 seconds — ABNORMAL HIGH (ref 24–37)

## 2014-03-16 MED ORDER — FUROSEMIDE 10 MG/ML IJ SOLN
40.0000 mg | Freq: Two times a day (BID) | INTRAMUSCULAR | Status: DC
  Administered 2014-03-16 – 2014-03-17 (×3): 40 mg via INTRAVENOUS
  Filled 2014-03-16 (×4): qty 4

## 2014-03-16 NOTE — Progress Notes (Signed)
ANTICOAGULATION CONSULT NOTE - Follow Up Consult  Pharmacy Consult for Heparin Indication: atrial fibrillation   No Known Allergies  Patient Measurements: Height: 6' (182.9 cm) Weight: 198 lb 6.6 oz (90 kg) IBW/kg (Calculated) : 77.6 Heparin Dosing Weight: 92kg  Vital Signs: Temp: 99.3 F (37.4 C) (07/06 1112) Temp src: Oral (07/06 1112) BP: 125/67 mmHg (07/06 1154) Pulse Rate: 71 (07/06 1154)  Labs:  Recent Labs  03/14/14 0250 03/15/14 0220 03/16/14 0310 03/16/14 1000  HGB 10.0* 9.6* 9.2*  --   HCT 30.3* 29.2* 27.8*  --   PLT 105* 111* 141*  --   APTT  --   --   --  131*  LABPROT  --   --   --  16.4*  INR  --   --   --  1.32  HEPARINUNFRC 0.35 0.42 0.63  --   CREATININE 0.71 0.73 0.72  --     Estimated Creatinine Clearance: 87.6 ml/min (by C-G formula based on Cr of 0.72).  Assessment: Kyle [name] s/p cardiac arrest/hypothermia continues on heparin for afib. Was on Coumadin prior to admission. Heparin level remains therapeutic (0.63) on 1850 units/hr. Hgb trending down some; platelet count has trended up to 141K.  No bleeding reported.  Goal of Therapy:  Heparin level 0.3-0.7 units/ml Monitor platelets by anticoagulation protocol: Yes   Plan:    Continue heparin drip at 1850 units/hr   Daily heparin level and CBC while on heparin.  Dennie FettersEgan, Journi Moffa Donovan, RPh Pager: [phone] 03/16/2014,1:25 PM

## 2014-03-16 NOTE — Procedures (Signed)
I have had extensive discussions with family . We discussed patients current circumstances and organ failures. We also discussed patient's prior wishes under circumstances such as this. Family has decided to NOT perform resuscitation if arrest but to continue current medical support for now. They r deciding on comfort care.  Mcarthur Rossettianiel J. Tyson AliasFeinstein, MD, FACP Pgr: [phone] [facility]

## 2014-03-16 NOTE — Progress Notes (Addendum)
Subjective: Patient unchanged.  Multiple family members present in the room this morning for update.    Objective: Current vital signs: BP 124/75  Pulse 89  Temp(Src) 99.5 F (37.5 C) (Oral)  Resp 25  Ht 6' (1.829 m)  Wt 90 kg (198 lb 6.6 oz)  BMI 26.90 kg/m2  SpO2 97% Vital signs in last 24 hours: Temp:  [98.5 F (36.9 C)-100.1 F (37.8 C)] 99.5 F (37.5 C) (07/06 0737) Pulse Rate:  [62-119] 89 (07/06 0900) Resp:  [14-25] 25 (07/06 0900) BP: (97-136)/(48-75) 124/75 mmHg (07/06 0900) SpO2:  [94 %-100 %] 97 % (07/06 0900) FiO2 (%):  [30 %] 30 % (07/06 0830) Weight:  [90 kg (198 lb 6.6 oz)] 90 kg (198 lb 6.6 oz) (07/06 0200)  Intake/Output from previous day: 07/05 0701 - 07/06 0700 In: 1278.2 [I.V.:670.5; NG/GT:457.7; IV Piggyback:150] Out: 2460 [Urine:2460] Intake/Output this shift: Total I/O In: -  Out: 1250 [Urine:1250] Nutritional status: NPO  Neurologic Exam: Mental Status:  Opens eyes when name called and seems to focus on examiner and follow around the room. Does not follow commands. No verbalizations noted.  Cranial Nerves:  II: Blinks to confrontation bilaterally, pupils right clouded and left 3 mm,and reactive bilaterally  III,IV,VI: doll's response present bilaterally.  V,VII: corneal reflexes intact  VIII: grossly intact  IX,X: gag reflex reduced, XI: trapezius strength unable to test bilaterally  XII: tongue strength unable to test  Motor:  Extremities flaccid throughout. No spontaneous movement noted. No purposeful movements noted.  Thrashes head back and forth. Sensory:  Does not respond to noxious stimuli in any extremity.  Deep Tendon Reflexes:  1+ with absent AJ's bilaterally  Plantars:  upgoing bilaterally  Cerebellar:  Unable to perform   Lab Results: Basic Metabolic Panel:  Recent Labs Lab 03/10/14 0615 03/12/14 0400 03/13/14 0305 03/14/14 0250 03/15/14 0220 03/16/14 0310  NA 142 142 140 140 141 140  K 4.4 4.4 4.6 4.6 4.7 4.0   CL 106 105 103 104 105 103  CO2 26 27 24 21 22 27   GLUCOSE 156* 142* 133* 172* 108* 117*  BUN 21 32* 34* 37* 37* 36*  CREATININE 0.77 0.74 0.71 0.71 0.73 0.72  CALCIUM 8.0* 8.2* 8.4 8.6 8.5 8.6  MG 1.9 2.1 2.1  --   --   --   PHOS 2.6 3.0 3.3  --   --   --     Liver Function Tests: No results found for this basename: AST, ALT, ALKPHOS, BILITOT, PROT, ALBUMIN,  in the last 168 hours No results found for this basename: LIPASE, AMYLASE,  in the last 168 hours No results found for this basename: AMMONIA,  in the last 168 hours  CBC:  Recent Labs Lab 03/10/14 0615 03/12/14 0400 03/13/14 0305 03/14/14 0250 03/15/14 0220 03/16/14 0310  WBC 4.7 5.3 5.0 6.0 6.7 5.5  NEUTROABS 3.6 4.5 4.1 5.2  --   --   HGB 11.2* 9.8* 10.0* 10.0* 9.6* 9.2*  HCT 33.3* 29.5* 30.4* 30.3* 29.2* 27.8*  MCV 96.8 97.4 98.1 97.7 99.3 97.9  PLT 66* 78* 88* 105* 111* 141*    Cardiac Enzymes: No results found for this basename: CKTOTAL, CKMB, CKMBINDEX, TROPONINI,  in the last 168 hours  Lipid Panel:  Recent Labs Lab 03/10/14 0615 03/10/14 2320 03/14/14 2056  TRIG 60 86 83    CBG:  Recent Labs Lab 03/15/14 1828 03/15/14 1956 03/16/14 0020 03/16/14 0320 03/16/14 0740  GLUCAP 108* 112* 118* 132* 125*  Microbiology: Results for orders placed during the hospital encounter of 2013-10-30  URINE CULTURE     Status: None   Collection Time    2013-10-30  7:55 PM      Result Value Ref Range Status   Specimen Description URINE, CATHETERIZED   Final   Special Requests NONE   Final   Culture  Setup Time     Final   Value: 03/05/2014 01:10     Performed at [facility]   Colony Count     Final   Value: NO GROWTH     Performed at [facility]   Culture     Final   Value: NO GROWTH     Performed at [facility]   Report Status 03/05/2014 FINAL   Final  MRSA PCR SCREENING     Status: None   Collection Time    03/05/14 12:31 AM      Result Value Ref Range Status   MRSA  by PCR NEGATIVE  NEGATIVE Final   Comment:            The GeneXpert MRSA Assay (FDA     approved for NASAL specimens     only), is one component of a     comprehensive MRSA colonization     surveillance program. It is not     intended to diagnose MRSA     infection nor to guide or     monitor treatment for     MRSA infections.  CULTURE, RESPIRATORY (NON-EXPECTORATED)     Status: None   Collection Time    03/11/14  9:54 AM      Result Value Ref Range Status   Specimen Description TRACHEAL ASPIRATE   Final   Special Requests NONE   Final   Gram Stain     Final   Value: MODERATE WBC PRESENT,BOTH PMN AND MONONUCLEAR     RARE SQUAMOUS EPITHELIAL CELLS PRESENT     FEW GRAM POSITIVE COCCI IN PAIRS     IN CLUSTERS FEW GRAM POSITIVE RODS     RARE GRAM NEGATIVE RODS   Culture     Final   Value: FEW KLEBSIELLA SPECIES     Performed at [facility]   Report Status 03/13/2014 FINAL   Final   Organism ID, Bacteria KLEBSIELLA SPECIES   Final  URINE CULTURE     Status: None   Collection Time    03/11/14  9:54 AM      Result Value Ref Range Status   Specimen Description URINE, CLEAN CATCH   Final   Special Requests NONE   Final   Culture  Setup Time     Final   Value: 03/11/2014 14:19     Performed at [facility]   Colony Count     Final   Value: NO GROWTH     Performed at [facility]   Culture     Final   Value: NO GROWTH     Performed at [facility]   Report Status 03/12/2014 FINAL   Final  CULTURE, BLOOD (ROUTINE X 2)     Status: None   Collection Time    03/11/14 11:10 AM      Result Value Ref Range Status   Specimen Description BLOOD LEFT ARM   Final   Special Requests BOTTLES DRAWN AEROBIC ONLY 7.5CC   Final   Culture  Setup Time     Final   Value:  03/11/2014 16:59     Performed at [facility]   Culture     Final   Value:        BLOOD CULTURE RECEIVED NO GROWTH TO DATE CULTURE WILL BE HELD FOR 5 DAYS BEFORE ISSUING A FINAL  NEGATIVE REPORT     Performed at [facility]   Report Status PENDING   Incomplete  CULTURE, BLOOD (ROUTINE X 2)     Status: None   Collection Time    03/11/14 11:15 AM      Result Value Ref Range Status   Specimen Description BLOOD LEFT HAND   Final   Special Requests BOTTLES DRAWN AEROBIC AND ANAEROBIC 10CC   Final   Culture  Setup Time     Final   Value: 03/11/2014 16:59     Performed at [facility]   Culture     Final   Value:        BLOOD CULTURE RECEIVED NO GROWTH TO DATE CULTURE WILL BE HELD FOR 5 DAYS BEFORE ISSUING A FINAL NEGATIVE REPORT     Performed at [facility]   Report Status PENDING   Incomplete    Coagulation Studies: No results found for this basename: LABPROT, INR,  in the last 72 hours  Imaging: Dg Chest Port 1 View  03/16/2014   CLINICAL DATA:  Intubation.  EXAM: PORTABLE CHEST - 1 VIEW  COMPARISON:  03/15/2014  FINDINGS: Port-A-Cath, right IJ line, NG tube, endotracheal tube in stable position. Persistent cardiomegaly. Pulmonary vascularity is normal. Bibasilar infiltrates particularly on the right noted. Right pleural effusion is present and has increased in size from prior exam. No pneumothorax. No acute osseous abnormality .  IMPRESSION: 1. Line and tube positions stable. 2. Bibasilar infiltrates particularly on the right remain. Right pleural effusion is present. This has increased in size from prior exam. 3. Stable cardiomegaly. A component of congestive heart failure cannot be excluded.   Electronically Signed   By: Maisie Fushomas  Register   On: 03/16/2014 07:30   Dg Chest Port 1 View  03/15/2014   CLINICAL DATA:  Respiratory failure.  EXAM: PORTABLE CHEST - 1 VIEW  COMPARISON:  One-view chest 03/14/2014.  FINDINGS: The endotracheal tube terminates 4.9 cm above the chronic, in satisfactory position. A right IJ Port-A-Cath is stable in position. The tip is greater than 2 vertebral bodies below the carina and may be within the right atrium,  unchanged. A left IJ line is stable in position. The heart is enlarged. A right pleural effusion is present. The left CP angle is not visualized. Right lower lobe airspace disease is noted. Mild edema is present.  IMPRESSION: 1. Persistent right pleural effusion and basilar airspace disease. While this likely represents atelectasis, infection is not excluded. 2. Increasing mild edema. 3. The support apparatus is stable.   Electronically Signed   By: Gennette Pachris  Mattern M.D.   On: 03/15/2014 08:31   Dg Chest Port 1 View  03/14/2014   CLINICAL DATA:  Post cardiac arrest.  Endotracheal tube replacement.  EXAM: PORTABLE CHEST - 1 VIEW 1953 hr:  COMPARISON:  Portable chest x-ray earlier same date 0517 hr and dating back to 03/10/2014.  FINDINGS: Endotracheal tube tip in satisfactory position approximately 7 cm above the carina. Left jugular central venous catheter tip projects at the junction of the innominate vein and SVC, unchanged. NG tube tip courses below the diaphragm into the stomach. Right jugular Port-A-Cath tip projects over the lower SVC. No evidence  of pneumothorax or pneumomediastinum.  Cardiac silhouette enlarged but stable. Pulmonary vascularity normal. Dense atelectasis in the lower lobes, worse than earlier today.  IMPRESSION: 1. Endotracheal tube tip in satisfactory position projecting approximately 7 cm above the carina. 2. Remaining support apparatus satisfactory. 3. Worsening atelectasis in the lower lobes since earlier today. 4. Stable cardiomegaly without pulmonary edema.   Electronically Signed   By: Hulan Saas M.D.   On: 03/14/2014 20:14   Dg Abd Portable 1v  03/14/2014   CLINICAL DATA:  OG tube placement at bedside.  EXAM: PORTABLE ABDOMEN - 1 VIEW  COMPARISON:  None.  FINDINGS: OG tube tip in the proximal body of the stomach. Only the extreme upper abdomen was included on the image, and the visualized bowel gas pattern is unremarkable.  IMPRESSION: OG tube tip in the proximal body of the  stomach.   Electronically Signed   By: Hulan Saas M.D.   On: 03/14/2014 20:15    Medications:  I have reviewed the patient's current medications. Scheduled: . antiseptic oral rinse  15 mL Mouth Rinse QID  . aspirin  81 mg Per Tube Daily  . atorvastatin  80 mg Per NG tube q1800  . captopril  6.25 mg Per NG tube TID  . chlorhexidine  15 mL Mouth Rinse BID  . fentaNYL  50 mcg Intravenous Once  . furosemide  40 mg Intravenous BID  . insulin aspart  0-15 Units Subcutaneous 6 times per day  . ipratropium-albuterol  3 mL Nebulization Q6H  . levETIRAcetam  1,000 mg Per Tube BID  . metoprolol tartrate  12.5 mg Per Tube BID  . pantoprazole sodium  40 mg Per Tube Daily  . phenytoin (DILANTIN) IV  100 mg Intravenous 3 times per day  . piperacillin-tazobactam (ZOSYN)  IV  3.375 g Intravenous 3 times per day  . sodium chloride  10-40 mL Intracatheter Q12H    Assessment/Plan: Patient without significant change from prior evaluation.  Spoke with Dr. Tyson Alias and family (45 minutes) today at length concerning patient's current status and prognosis.  Unfortunately the patient has now been hospitalized 12 days and has not had a significant improvement.  This is not good prognostically, nor is his other end organ damage and early presentation with seizures.  Patient is not brain dead but clearly not even close to baseline or a functional level.  At best do not feel that this is possible in the short run.  Family deciding between tracheostomy versus comfort care.    Recommendations:  Agree with continued anticonvulsant therapy   LOS: 12 days   Thana Farr, MD [facility] [phone] 03/16/2014  10:25 AM

## 2014-03-16 NOTE — Progress Notes (Signed)
SUBJECTIVE:  73 y/o male with previous CVA admitted witnessed OOH arrest requiring CPR and cooling. 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 His anatomy is not favorable for PCI. He will need consideration for CABG if he recovers neurological function. LVEF is 25% by LV gram. Echo with LVEF=30-35%.   Remains Intubated. Opens eyes but no purposeful response.   VT arrest on 6/28.  Shocked with 150J and quickly returned to his baseline AF.      Tele: afib with controlled ventricular response  BP 111/55  Pulse 77  Temp(Src) 99.3 F (37.4 C) (Oral)  Resp 15  Ht 6' (1.829 m)  Wt 198 lb 6.6 oz (90 kg)  BMI 26.90 kg/m2  SpO2 98%  Intake/Output Summary (Last 24 hours) at 03/16/14 0723 Last data filed at 03/16/14 0600  Gross per 24 hour  Intake 1204.67 ml  Output   2460 ml  Net -1255.33 ml    PHYSICAL EXAM General: Intubated. Opens eyes, no purposeful movements Neck: supple Lungs: diffuse rhonchi Heart: Irregular  Abdomen: Soft, not distended Extremities: 1-2+ edema  LABS: Basic Metabolic Panel:  Recent Labs  16/06/9606/05/15 0220 03/16/14 0310  NA 141 140  K 4.7 4.0  CL 105 103  CO2 22 27  GLUCOSE 108* 117*  BUN 37* 36*  CREATININE 0.73 0.72  CALCIUM 8.5 8.6   CBC:  Recent Labs  03/14/14 0250 03/15/14 0220 03/16/14 0310  WBC 6.0 6.7 5.5  NEUTROABS 5.2  --   --   HGB 10.0* 9.6* 9.2*  HCT 30.3* 29.2* 27.8*  MCV 97.7 99.3 97.9  PLT 105* 111* 141*  Current Meds: . antiseptic oral rinse  15 mL Mouth Rinse QID  . aspirin  81 mg Per Tube Daily  . atorvastatin  80 mg Per NG tube q1800  . captopril  6.25 mg Per NG tube TID  . chlorhexidine  15 mL Mouth Rinse BID  . fentaNYL  50 mcg Intravenous Once  . furosemide  40 mg Intravenous Daily  . insulin aspart  0-15 Units Subcutaneous 6 times per day  . ipratropium-albuterol  3 mL Nebulization Q6H  . levETIRAcetam  1,000 mg Per Tube BID  . metoprolol tartrate  12.5 mg Per Tube BID  . pantoprazole sodium  40  mg Per Tube Daily  . phenytoin (DILANTIN) IV  100 mg Intravenous 3 times per day  . piperacillin-tazobactam (ZOSYN)  IV  3.375 g Intravenous 3 times per day  . sodium chloride  10-40 mL Intracatheter Q12H   Cardiac cath 03/05/14:  Left mainstem: There is diffuse calcification with 40% ostial left main disease. The left main tapers distally to 30%.  Left anterior descending (LAD): The LAD is a large vessel that gives rise to a moderately large diagonal branch. In the proximal LAD there is an aneurysmal segment followed immediately by a severe bifurcation stenosis 1:1:1 in the LAD and first diagonal up to 95%.  There is a moderately large ramus branch without significant disease.  Left circumflex (LCx): The LCx has mild ostial disease but otherwise has no significant stenosis.  Right coronary artery (RCA): Dominant vessel with 70% proximal stenosis. The crux of the RCA is severely diseased up to 90%. The Distal RCA is occluded with right to right and left to right collaterals.  Left ventriculography: Left ventricular systolic function is abnormal, LV size is increased with extensive inferior akinesis and global hypokinesis. LVEF is estimated at 25%, there is mild to moderate mitral regurgitation  Echo 03/05/14: Left ventricle: The cavity size was moderately dilated. Systolic function was moderately to severely reduced. The estimated ejection fraction was in the range of 30% to 35%. There is akinesis of the entireinferior myocardium. There is akinesis of the basal-midinferolateral myocardium. - Aortic valve: Moderate thickening and calcification, consistent with sclerosis. There was trivial regurgitation. - Ascending aorta: The ascending aorta was mildly dilated. - Left atrium: The atrium was mildly dilated.  ASSESSMENT AND PLAN:   1. Ventricular fibrillation arrest, out of hospital: Witnessed arrest at home. Likely primary arrythmia event with ischemic cardiomyopathy. Bystander CPR. Admitted  by CCM. Pt found to have high grade CAD with complex disease involving the LAD/Diagonal bifurcation and severe disease in the RCA but these lesions were not acute. No PCI performed. S/p Cooling protocol per PCCM. Recurrent VT on 6/28. Shocked. Stopped amio due to bradycardia with seizure meds.  2. CAD: Severe double vessel disease by cath 03/05/14. His anatomy is not favorable for PCI. He would need consideration for CABG if he recovers neurological function.On ASA, b-blocker and statin. LVEF is 25% by LV gram. Echo with LVEF=30-35%.   Neurology reports that there is "Prognosis poor for functional recovery."  He is not a candidate for revascularization, either with PCI or CABG- unless his neuro status improves significantly.    3. Ischemic cardiomyopathy: As above. Will follow for now. Continue beta blocker and ACEI. Volume overloaded on exam; increase lasix to 40 mg IV BID; follow renal function. May be a component of poor oncotic pressure; check albumin.  4. Atrial fib/AFL: rate controlled. Continue metoprolol. Continue heparin.    5. Anoxic brain injury:  Per neurology    6. H/o CVA  7. Fever: antibiotics per CCM    Olga MillersBrian Calyn Rubi MD  7/6/20157:23 AM

## 2014-03-16 NOTE — Progress Notes (Signed)
Name: Kyle [name] MRN: [id number] DOB: 08/18/1941  ELECTRONIC ICU PHYSICIAN NOTE  Problem:  Nursing reports fm may be withdrawing care in am and requesting to d/c meds  Intervention:  D/c cbgs as not needed for now  but leave other interventions as they are until firm time frame on shifting to purely comfort care  Sandrea HughsMichael Reid Regas 03/16/2014, 9:00 PM

## 2014-03-16 NOTE — Progress Notes (Addendum)
PULMONARY / CRITICAL CARE MEDICINE   Name: Kyle [name] MRN: [id number] DOB: 06/16/1941    ADMISSION DATE:  02/13/2014 CONSULTATION DATE:  02/17/2014  REFERRING MD :  EDP PRIMARY SERVICE: PCCM  CHIEF COMPLAINT:  Cardiac arrest  BRIEF PATIENT DESCRIPTION: 73 year old male with PMH MI, CVA, A-fib, and seizures suffered witnessed cardiac arrest 6/24.  Initial rhythm VF. ROSC achieved with defibrillation, CPR and amiodarone. In ED found to have ECG changes.  SIGNIFICANT EVENTS / STUDIES:  6/24- VF arrest 10-15 min 7/2 mri brain>>Chronic left temporal and parietal encephalomalacia.<BR>2. No acute intracranial abnormality.<BR>> 7/4 self extubation, reintubated  LINES / TUBES: R - port-a-cath (chronic) [facility]J CVL 6/24 >>> ETT 6/24 >>>  CULTURES: Urine 6/24 >>> NEG MRSA PCR 6/24 >>> NEG resp c/s 7/1>>klebsiella BC x 2 7/1 >>NG Urine 7/1>>NEG  ANTIBIOTICS: Anti-infectives   Start     Dose/Rate Route Frequency Ordered Stop   03/11/14 1000  piperacillin-tazobactam (ZOSYN) IVPB 3.375 g     3.375 g 12.5 mL/hr over 240 Minutes Intravenous 3 times per day 03/11/14 0938        SUBJECTIVE:  No major changes in neurostatus  VITAL SIGNS: Temp:  [98.5 F (36.9 C)-100.1 F (37.8 C)] 99.5 F (37.5 C) (07/06 0737) Pulse Rate:  [66-119] 78 (07/06 0830) Resp:  [14-25] 25 (07/06 0830) BP: (97-138)/(48-83) 136/69 mmHg (07/06 0830) SpO2:  [94 %-100 %] 98 % (07/06 0830) FiO2 (%):  [30 %] 30 % (07/06 0830) Weight:  [90 kg (198 lb 6.6 oz)] 90 kg (198 lb 6.6 oz) (07/06 0200)  HEMODYNAMICS: CV stable    VENTILATOR SETTINGS: Vent Mode:  [-] CPAP;PSV FiO2 (%):  [30 %] 30 % Set Rate:  [14 bmp] 14 bmp Vt Set:  [550 mL] 550 mL PEEP:  [5 cmH20] 5 cmH20 Pressure Support:  [5 cmH20] 5 cmH20 Plateau Pressure:  [13 cmH20-15 cmH20] 14 cmH20  INTAKE / OUTPUT: Intake/Output     07/05 0701 - 07/06 0700 07/06 0701 - 07/07 0700   I.V. (mL/kg) 670.5 (7.5)    NG/GT 457.7    IV Piggyback 150     Total Intake(mL/kg) 1278.2 (14.2)    Urine (mL/kg/hr) 2460 (1.1)    Total Output 2460     Net -1181.8           PHYSICAL EXAMINATION: General:no distress Neuro: rass -2, not tracking, not following commands HEENT:  [place]/AT, pupils asymmetric with R cataract Cardiovascular: s1 s2 irr Lungs:  Rhonchi, coarse Abdomen:  Soft, non-distended. BS + Ext. Mild peripheral edema.  LABS:  PULMONARY No results found for this basename: PHART, PCO2, PCO2ART, PO2, PO2ART, HCO3, TCO2, O2SAT,  in the last 168 hours  CBC  Recent Labs Lab 03/14/14 0250 03/15/14 0220 03/16/14 0310  HGB 10.0* 9.6* 9.2*  HCT 30.3* 29.2* 27.8*  WBC 6.0 6.7 5.5  PLT 105* 111* 141*    COAGULATION No results found for this basename: INR,  in the last 168 hours  CARDIAC   No results found for this basename: TROPONINI,  in the last 168 hours  Recent Labs Lab 03/12/14 0500  PROBNP 3803.0*     CHEMISTRY  Recent Labs Lab 03/10/14 0615 03/12/14 0400 03/13/14 0305 03/14/14 0250 03/15/14 0220 03/16/14 0310  NA 142 142 140 140 141 140  K 4.4 4.4 4.6 4.6 4.7 4.0  CL 106 105 103 104 105 103  CO2 26 27 24 21 22 27   GLUCOSE 156* 142* 133* 172* 108* 117*  BUN 21 32*  34* 37* 37* 36*  CREATININE 0.77 0.74 0.71 0.71 0.73 0.72  CALCIUM 8.0* 8.2* 8.4 8.6 8.5 8.6  MG 1.9 2.1 2.1  --   --   --   PHOS 2.6 3.0 3.3  --   --   --    Estimated Creatinine Clearance: 87.6 ml/min (by C-G formula based on Cr of 0.72).   LIVER No results found for this basename: AST, ALT, ALKPHOS, BILITOT, PROT, ALBUMIN, INR,  in the last 168 hours   INFECTIOUS  Recent Labs Lab 03/11/14 0904 03/12/14 0400 03/13/14 0305  PROCALCITON 0.10 0.10 <0.10     ENDOCRINE CBG (last 3)   Recent Labs  03/15/14 1956 03/16/14 0020 03/16/14 0320  GLUCAP 112* 118* 132*    All imaging reviewed prior 48hrs.  pcxr - rt base effusion, ett wnl   ASSESSMENT / PLAN: Principal Problem:   Cardiac arrest Active Problems:    Seizure   Acute respiratory failure   Coronary atherosclerosis of native coronary artery   Cardiomyopathy, ischemic   Atrial fibrillation   Anoxic brain damage   COPD (chronic obstructive pulmonary disease) with chronic bronchitis   HCAP (healthcare-associated pneumonia) due to Klebsiella   PULMONARY A: Acute respiratory failure 2nd to cardiac arrest H/o COPD Lung nodule on CT Klebsiella HCAP, bilateral R>L Lower lobe infiltrates worse 7/4  - does not meet sbt/extubation criteria due to mental status on 03/14/14  P:   Cont vent bundle SBT cpap5 ps 5-10, goal 2 hrs, no extubation planned BDs NEEDS TRACH or one way extubation - discussions active with family now Neg balance for effusion as able  CARDIOVASCULAR A:  VF arrest  Hypertension Atrial fib Critical CAD  P:   MAP goal at 60 mmHg in setting low EF Cards following Consider CABG if neuro recovery good  RENAL A:   normal  P:   Lytes in am  Continued lasix per cards Chem in am   GASTROINTESTINAL A:   No issues P:   SUP:per tube PPI TFs to cont  HEMATOLOGIC A:   Chronic warfarin therapy for CAF Thrombocytopenia of uncertain etiology but no change on IV heparin P:  DVT px: SCDs Monitor CBC intermittently Transfuse per usual ICU guidelines Holding warfarin but on IV heparin gtt per cards until we decide on trach  INFECTIOUS A:  Klebsiella HCAP R and L Lower lobes as fever source P:   Cont zosyn, it is a pansens organisms, need to establish stop date soon or narrow  ENDOCRINE A:    Hyperglycemia without prior DM P:   Cont SSI  NEUROLOGIC A:   Acute encephalopathy, post anoxic  H/o seizures, CVA No further seizures since 03/11/14  Prognosis for fxnal recovery seems poor P:   Fentanyl qtt only RASS goal -1 to 0   TODAYS SUMMARY Will discuss with family comfort vs trach, weaning, lasix  The patient is critically ill with multiple organ systems failure and requires high complexity decision  making for assessment and support, frequent evaluation and titration of therapies, application of advanced monitoring technologies and extensive interpretation of multiple databases.   Ccm time 35 min   Mcarthur Rossettianiel J. Tyson AliasFeinstein, MD, FACP Pgr: [phone] [facility]

## 2014-03-17 LAB — COMPREHENSIVE METABOLIC PANEL
ALBUMIN: 1.9 g/dL — AB (ref 3.5–5.2)
ALT: 40 U/L (ref 0–53)
AST: 41 U/L — AB (ref 0–37)
Alkaline Phosphatase: 106 U/L (ref 39–117)
Anion gap: 11 (ref 5–15)
BILIRUBIN TOTAL: 0.7 mg/dL (ref 0.3–1.2)
BUN: 33 mg/dL — ABNORMAL HIGH (ref 6–23)
CHLORIDE: 103 meq/L (ref 96–112)
CO2: 26 mEq/L (ref 19–32)
CREATININE: 0.64 mg/dL (ref 0.50–1.35)
Calcium: 7.8 mg/dL — ABNORMAL LOW (ref 8.4–10.5)
GFR calc Af Amer: >90 mL/min (ref >90)
GFR calc non Af Amer: >90 mL/min (ref >90)
Glucose, Bld: 114 mg/dL — ABNORMAL HIGH (ref 70–99)
Potassium: 3.7 mEq/L (ref 3.7–5.3)
Sodium: 140 mEq/L (ref 137–147)
TOTAL PROTEIN: 4.8 g/dL — AB (ref 6.0–8.3)

## 2014-03-17 LAB — CBC WITH DIFFERENTIAL/PLATELET
BASOS ABS: 0 10*3/uL (ref 0.0–0.1)
Basophils Relative: 0 % (ref 0–1)
EOS PCT: 1 % (ref 0–5)
Eosinophils Absolute: 0.1 10*3/uL (ref 0.0–0.7)
HEMATOCRIT: 26.9 % — AB (ref 39.0–52.0)
HEMOGLOBIN: 8.8 g/dL — AB (ref 13.0–17.0)
LYMPHS ABS: 0.5 10*3/uL — AB (ref 0.7–4.0)
LYMPHS PCT: 11 % — AB (ref 12–46)
MCH: 31.8 pg (ref 26.0–34.0)
MCHC: 32.7 g/dL (ref 30.0–36.0)
MCV: 97.1 fL (ref 78.0–100.0)
MONO ABS: 0.3 10*3/uL (ref 0.1–1.0)
MONOS PCT: 6 % (ref 3–12)
Neutro Abs: 4 10*3/uL (ref 1.7–7.7)
Neutrophils Relative %: 82 % — ABNORMAL HIGH (ref 43–77)
Platelets: 160 10*3/uL (ref 150–400)
RBC: 2.77 MIL/uL — ABNORMAL LOW (ref 4.22–5.81)
RDW: 14.1 % (ref 11.5–15.5)
WBC: 4.8 10*3/uL (ref 4.0–10.5)

## 2014-03-17 LAB — CULTURE, BLOOD (ROUTINE X 2)
CULTURE: NO GROWTH
CULTURE: NO GROWTH

## 2014-03-17 LAB — HEPARIN LEVEL (UNFRACTIONATED): HEPARIN UNFRACTIONATED: 0.61 [IU]/mL (ref 0.30–0.70)

## 2014-03-17 MED ORDER — LORAZEPAM 2 MG/ML IJ SOLN
5.0000 mg/h | INTRAMUSCULAR | Status: DC
  Administered 2014-03-17: 5 mg/h via INTRAVENOUS
  Filled 2014-03-17: qty 25

## 2014-03-17 MED ORDER — LORAZEPAM BOLUS VIA INFUSION
2.0000 mg | INTRAVENOUS | Status: DC | PRN
  Filled 2014-03-17: qty 5

## 2014-03-17 MED ORDER — BIOTENE DRY MOUTH MT LIQD: 15.0000 mL | OROMUCOSAL | Status: DC | PRN

## 2014-03-17 MED ORDER — MORPHINE SULFATE 10 MG/ML IJ SOLN
10.0000 mg/h | INTRAVENOUS | Status: DC
  Administered 2014-03-17: 10 mg/h via INTRAVENOUS
  Filled 2014-03-17: qty 10

## 2014-03-17 MED ORDER — POTASSIUM CHLORIDE 20 MEQ/15ML (10%) PO LIQD
20.0000 meq | ORAL | Status: DC
  Administered 2014-03-17: 20 meq
  Filled 2014-03-17: qty 15

## 2014-03-17 MED ORDER — MORPHINE BOLUS VIA INFUSION
5.0000 mg | INTRAVENOUS | Status: DC | PRN
  Filled 2014-03-17: qty 20

## 2014-04-02 NOTE — Discharge Summary (Signed)
NAMLinna Hoff:  Malanowski, Maxwell               ACCOUNT NO.:  [id number]  MEDICAL RECORD NO.:  [id number]  LOCATION:  2H06C                        FACILITY:  [facility]  PHYSICIAN:  Nelda Bucksaniel J Priyah Schmuck, MD DATE OF BIRTH:  October 11, 1940  DATE OF ADMISSION:  03/08/2014 DATE OF DISCHARGE:  04/03/2014                              DISCHARGE SUMMARY   DEATH SUMMARY  This is a 73 year old male with a past medical history of myocardial infarction, stroke, atrial fibrillation, seizures, suffered a witnessed cardiac arrest on March 04, 2014, initial rhythm was ventricular fibrillation.  Patient now returned to spontaneous circulation, achieved with defibrillation CPR, and amiodarone was administered.  In the ED, the patient was found to have EKG changes.  The cardiac arrest was total, was about 10-15 minutes.  The patient underwent hypothermia protocol, had an MRI of the brain, with chronic left temporal and parietal encephalomalacia.  No acute intracranial abnormality is otherwise noted.  Patient remained to be vented with poor weaning attempts and progression and continued severe apparent anoxic brain injury, with lack of neurologic progress.  On March 14, 2014, he self- extubated and reintubated very very quickly secondary to continued hypoxia.  The patient had respiratory culture significant for Klebsiella, was administered Zosyn for antibiotics.  Since the patient did not progress neurologically, family opted for comfort care based on his prior discussions.  Comfort care was provided and the patient expired.  FINAL DIAGNOSES UPON DEATH: 1. Severe anoxic brain injury. 2. Status post cardiac arrest. 3. Ventricular fibrillation arrest. 4. Pneumonia. 5. Seizures. 6. Coronary atherosclerosis of the native coronary artery. 7. Cardiomyopathy. 8. Atrial fibrillation. 9. Chronic obstructive pulmonary disease. 10.Hospital-acquired pneumonia.     Nelda Bucksaniel J Branon Sabine, MD     DJF/MEDQ  D:  04/01/2014  T:   04/02/2014  Job:  [id number]

## 2014-04-11 NOTE — Progress Notes (Signed)
PULMONARY / CRITICAL CARE MEDICINE   Name: Kyle [name] MRN: [id number] DOB: 01/26/1941    ADMISSION DATE:  02/09/2014 CONSULTATION DATE:  02/25/2014  REFERRING MD :  EDP PRIMARY SERVICE: PCCM  CHIEF COMPLAINT:  Cardiac arrest  BRIEF PATIENT DESCRIPTION: 73 year old male with PMH MI, CVA, A-fib, and seizures suffered witnessed cardiac arrest 6/24.  Initial rhythm VF. ROSC achieved with defibrillation, CPR and amiodarone. In ED found to have ECG changes.  SIGNIFICANT EVENTS / STUDIES:  6/24- VF arrest 10-15 min 7/2 mri brain>>Chronic left temporal and parietal encephalomalacia.<BR>2. No acute intracranial abnormality.<BR>> 7/4 self extubation, reintubated  LINES / TUBES: R - port-a-cath (chronic) LIJ CVL 6/24 >>> ETT 6/24 >>>  CULTURES: Urine 6/24 >>> NEG MRSA PCR 6/24 >>> NEG resp c/s 7/1>>klebsiella BC x 2 7/1 >>NG Urine 7/1>>NEG  ANTIBIOTICS: Anti-infectives   Start     Dose/Rate Route Frequency Ordered Stop   03/11/14 1000  piperacillin-tazobactam (ZOSYN) IVPB 3.375 g     3.375 g 12.5 mL/hr over 240 Minutes Intravenous 3 times per day 03/11/14 0938        SUBJECTIVE:  Eyes open, moving head  VITAL SIGNS: Temp:  [99.1 F (37.3 C)-99.7 F (37.6 C)] 99.3 F (37.4 C) (07/07 0700) Pulse Rate:  [45-90] 75 (07/07 0800) Resp:  [14-25] 14 (07/07 0800) BP: (95-136)/(51-87) 95/54 mmHg (07/07 0800) SpO2:  [95 %-100 %] 97 % (07/07 0800) FiO2 (%):  [30 %] 30 % (07/07 0740) Weight:  [88.6 kg (195 lb 5.2 oz)] 88.6 kg (195 lb 5.2 oz) (07/07 0500)  HEMODYNAMICS: CV stable    VENTILATOR SETTINGS: Vent Mode:  [-] PRVC FiO2 (%):  [30 %] 30 % Set Rate:  [14 bmp] 14 bmp Vt Set:  [550 mL] 550 mL PEEP:  [5 cmH20] 5 cmH20 Pressure Support:  [5 cmH20] 5 cmH20 Plateau Pressure:  [9 cmH20-18 cmH20] 15 cmH20  INTAKE / OUTPUT: Intake/Output     07/06 0701 - 07/07 0700 07/07 0701 - 07/08 0700   I.V. (mL/kg) 751.5 (8.5) 23.5 (0.3)   NG/GT 1607.7 70   IV Piggyback 150     Total Intake(mL/kg) 2509.2 (28.3) 93.5 (1.1)   Urine (mL/kg/hr) 3825 (1.8)    Total Output 3825     Net -1315.8 +93.5         PHYSICAL EXAMINATION: General:no distress remains on vent Neuro: not tracking, not following commands, rass -3 HEENT:  No change sin pupillary findings Cardiovascular: s1 s2 irr Lungs:  Rhonchi, coarse increased Abdomen:  Soft, non-distended. BS + hypo Ext. Mild peripheral edema.  LABS:  PULMONARY No results found for this basename: PHART, PCO2, PCO2ART, PO2, PO2ART, HCO3, TCO2, O2SAT,  in the last 168 hours  CBC  Recent Labs Lab 03/15/14 0220 03/16/14 0310 03/22/2014 0330  HGB 9.6* 9.2* 8.8*  HCT 29.2* 27.8* 26.9*  WBC 6.7 5.5 4.8  PLT 111* 141* 160    COAGULATION  Recent Labs Lab 03/16/14 1000  INR 1.32    CARDIAC   No results found for this basename: TROPONINI,  in the last 168 hours  Recent Labs Lab 03/12/14 0500  PROBNP 3803.0*     CHEMISTRY  Recent Labs Lab 03/12/14 0400 03/13/14 0305 03/14/14 0250 03/15/14 0220 03/16/14 0310 03/13/2014 0330  NA 142 140 140 141 140 140  K 4.4 4.6 4.6 4.7 4.0 3.7  CL 105 103 104 105 103 103  CO2 27 24 21 22 27 26   GLUCOSE 142* 133* 172* 108* 117* 114*  BUN  32* 34* 37* 37* 36* 33*  CREATININE 0.74 0.71 0.71 0.73 0.72 0.64  CALCIUM 8.2* 8.4 8.6 8.5 8.6 7.8*  MG 2.1 2.1  --   --   --   --   PHOS 3.0 3.3  --   --   --   --    Estimated Creatinine Clearance: 87.6 ml/min (by C-G formula based on Cr of 0.64).   LIVER  Recent Labs Lab 03/16/14 1000 2014-07-20 0330  AST  --  41*  ALT  --  40  ALKPHOS  --  106  BILITOT  --  0.7  PROT  --  4.8*  ALBUMIN  --  1.9*  INR 1.32  --      INFECTIOUS  Recent Labs Lab 03/11/14 0904 03/12/14 0400 03/13/14 0305  PROCALCITON 0.10 0.10 <0.10     ENDOCRINE CBG (last 3)   Recent Labs  03/16/14 1117 03/16/14 1615 03/16/14 1956  GLUCAP 141* 155* 127*    All imaging reviewed prior 48hrs.  pcxr - rt base effusion, ett wnl  7/6   ASSESSMENT / PLAN: Principal Problem:   Cardiac arrest Active Problems:   Seizure   Acute respiratory failure   Coronary atherosclerosis of native coronary artery   Cardiomyopathy, ischemic   Atrial fibrillation   Anoxic brain damage   COPD (chronic obstructive pulmonary disease) with chronic bronchitis   HCAP (healthcare-associated pneumonia) due to Klebsiella   PULMONARY A: Acute respiratory failure 2nd to cardiac arrest H/o COPD Lung nodule on CT Klebsiella HCAP, bilateral R>L Lower lobe infiltrates worse 7/4  - does not meet sbt/extubation criteria due to mental status on 03/14/14  New bloody secretions P:   Cont vent bundle Will finalize again decision for comofrt care this am  Will assess ps 10-12 attempts and if for comfort will assess on cpap 5 ps 5, titrate morphine prior and during these 30 sec looks BDs Neg balance for effusion as able, successful last 24 hrs Dc heparin Limit suctioning  CARDIOVASCULAR A:  VF arrest  Hypertension Atrial fib Critical CAD  P:   MAP goal at 60 mmHg in setting low EF Cards following unlikely to ever have cabg  RENAL A:   normal  P:   Continued lasix per cards- dc if for comfort  GASTROINTESTINAL A:   No issues P:   SUP:per tube PPI  TF- dc  HEMATOLOGIC A:   Chronic warfarin therapy for CAF Thrombocytopenia of uncertain etiology but no change on IV heparin P:  DVT px: SCDs Limit blood draws  INFECTIOUS A:  Klebsiella HCAP R and L Lower lobes as fever source P:   allow abx to dc  ENDOCRINE A:    Hyperglycemia without prior DM P:   Cont SSI  NEUROLOGIC A:   Acute encephalopathy, post anoxic  H/o seizures, CVA No further seizures since 03/11/14  Prognosis for fxnal recovery seems poor P:   Fentanyl qtt to transition to moprhine if for comfort extensive updates for comfort care per family   TODAYS SUMMARY Will discuss with family comfort, weaning looks  The patient is critically ill  with multiple organ systems failure and requires high complexity decision making for assessment and support, frequent evaluation and titration of therapies, application of advanced monitoring technologies and extensive interpretation of multiple databases.   Ccm time 30 min   Mcarthur Rossettianiel J. Tyson AliasFeinstein, MD, FACP Pgr: [phone] [facility]

## 2014-04-11 NOTE — Progress Notes (Signed)
Patient receiving comfort care.  Patient asystole & apneic on monitor, no heart tones or lung sounds auscultated x 1 minute by 2 RN's [facility](Holly Rivesvillehurch, RN & Prince RomeJessica Arianie Couse, [place]).  Family at bedside, attending MD notified.  Emotional support provided to family, will continue to monitor for further needs. [place], Mitzi HansenJessica Marie

## 2014-04-11 NOTE — Progress Notes (Signed)
100 ml bag of Fentanyl wasted in sink witnessed by H. Shakayla Hickox, RN  & Everlean AlstromJ. Milford, RN.

## 2014-04-11 NOTE — Progress Notes (Signed)
Nutrition Brief Note  Chart reviewed. Pt now transitioning to comfort care.  No further nutrition interventions warranted at this time.  Please re-consult as needed.   Lamberto Dinapoli MS, RD, LDN Inpatient Registered Dietitian Pager: [phone] After-hours pager: [phone]    

## 2014-04-11 NOTE — Progress Notes (Addendum)
Approx 25 mL of 50 mL IV bag of Ativan and approx 10 mL of IV Morphine bag wasted in sink witnessed by H. Bernedette Auston, RN & Everlean AlstromJ. Milford, RN.

## 2014-04-11 NOTE — Progress Notes (Addendum)
Subjective: Patient unchanged.  Family has decided upon comfort care.  Will likely be extubating later this morning. No seizure activity noted.    Objective: Current vital signs: BP 121/60  Pulse 75  Temp(Src) 99.3 F (37.4 C) (Oral)  Resp 14  Ht 6' (1.829 m)  Wt 88.6 kg (195 lb 5.2 oz)  BMI 26.49 kg/m2  SpO2 97% Vital signs in last 24 hours: Temp:  [99.1 F (37.3 C)-99.7 F (37.6 C)] 99.3 F (37.4 C) (07/07 0700) Pulse Rate:  [45-90] 75 (07/07 0800) Resp:  [14-25] 14 (07/07 0800) BP: (105-136)/(51-87) 121/60 mmHg (07/07 0700) SpO2:  [95 %-100 %] 97 % (07/07 0800) FiO2 (%):  [30 %] 30 % (07/07 0740) Weight:  [88.6 kg (195 lb 5.2 oz)] 88.6 kg (195 lb 5.2 oz) (07/07 0500)  Intake/Output from previous day: 07/06 0701 - 07/07 0700 In: 2509.2 [I.V.:751.5; NG/GT:1607.7; IV Piggyback:150] Out: 3825 [Urine:3825] Intake/Output this shift: Total I/O In: 93.5 [I.V.:23.5; NG/GT:70] Out: 5 [Emesis/NG output:5] Nutritional status: NPO  Neurologic Exam: Mental Status:  Opens eyes when name called and seems to focus on examiner and follow around the room. Does not follow commands. No verbalizations noted.  Cranial Nerves:  II: Blinks to confrontation bilaterally, pupils right clouded and left 3 mm,and reactive bilaterally  III,IV,VI: doll's response present bilaterally.  V,VII: corneal reflexes intact  VIII: grossly intact  IX,X: gag reflex reduced, XI: trapezius strength unable to test bilaterally  XII: tongue strength unable to test  Motor:  Extremities flaccid throughout. No spontaneous movement noted. No purposeful movements noted.  Sensory:  Does not respond to noxious stimuli in any extremity.  Deep Tendon Reflexes:  1+ with absent AJ's bilaterally  Plantars:  upgoing bilaterally  Cerebellar:  Unable to perform   Lab Results: Basic Metabolic Panel:  Recent Labs Lab 03/12/14 0400 03/13/14 0305 03/14/14 0250 03/15/14 0220 03/16/14 0310 04/09/2014 0330  NA 142  140 140 141 140 140  K 4.4 4.6 4.6 4.7 4.0 3.7  CL 105 103 104 105 103 103  CO2 27 24 21 22 27 26   GLUCOSE 142* 133* 172* 108* 117* 114*  BUN 32* 34* 37* 37* 36* 33*  CREATININE 0.74 0.71 0.71 0.73 0.72 0.64  CALCIUM 8.2* 8.4 8.6 8.5 8.6 7.8*  MG 2.1 2.1  --   --   --   --   PHOS 3.0 3.3  --   --   --   --     Liver Function Tests:  Recent Labs Lab 04/07/2014 0330  AST 41*  ALT 40  ALKPHOS 106  BILITOT 0.7  PROT 4.8*  ALBUMIN 1.9*   No results found for this basename: LIPASE, AMYLASE,  in the last 168 hours No results found for this basename: AMMONIA,  in the last 168 hours  CBC:  Recent Labs Lab 03/12/14 0400 03/13/14 0305 03/14/14 0250 03/15/14 0220 03/16/14 0310 18-Mar-2014 0330  WBC 5.3 5.0 6.0 6.7 5.5 4.8  NEUTROABS 4.5 4.1 5.2  --   --  4.0  HGB 9.8* 10.0* 10.0* 9.6* 9.2* 8.8*  HCT 29.5* 30.4* 30.3* 29.2* 27.8* 26.9*  MCV 97.4 98.1 97.7 99.3 97.9 97.1  PLT 78* 88* 105* 111* 141* 160    Cardiac Enzymes: No results found for this basename: CKTOTAL, CKMB, CKMBINDEX, TROPONINI,  in the last 168 hours  Lipid Panel:  Recent Labs Lab 03/10/14 2320 03/14/14 2056  TRIG 86 83    CBG:  Recent Labs Lab 03/16/14 0320 03/16/14 0740 03/16/14 1117  03/16/14 1615 03/16/14 1956  GLUCAP 132* 125* 141* 155* 127*    Microbiology: Results for orders placed during the hospital encounter of 02/19/2014  URINE CULTURE     Status: None   Collection Time    02/18/2014  7:55 PM      Result Value Ref Range Status   Specimen Description URINE, CATHETERIZED   Final   Special Requests NONE   Final   Culture  Setup Time     Final   Value: 03/05/2014 01:10     Performed at [facility]   Colony Count     Final   Value: NO GROWTH     Performed at [facility]   Culture     Final   Value: NO GROWTH     Performed at [facility]   Report Status 03/05/2014 FINAL   Final  MRSA PCR SCREENING     Status: None   Collection Time    03/05/14 12:31 AM       Result Value Ref Range Status   MRSA by PCR NEGATIVE  NEGATIVE Final   Comment:            The GeneXpert MRSA Assay (FDA     approved for NASAL specimens     only), is one component of a     comprehensive MRSA colonization     surveillance program. It is not     intended to diagnose MRSA     infection nor to guide or     monitor treatment for     MRSA infections.  CULTURE, RESPIRATORY (NON-EXPECTORATED)     Status: None   Collection Time    03/11/14  9:54 AM      Result Value Ref Range Status   Specimen Description TRACHEAL ASPIRATE   Final   Special Requests NONE   Final   Gram Stain     Final   Value: MODERATE WBC PRESENT,BOTH PMN AND MONONUCLEAR     RARE SQUAMOUS EPITHELIAL CELLS PRESENT     FEW GRAM POSITIVE COCCI IN PAIRS     IN CLUSTERS FEW GRAM POSITIVE RODS     RARE GRAM NEGATIVE RODS   Culture     Final   Value: FEW KLEBSIELLA SPECIES     Performed at [facility]   Report Status 03/13/2014 FINAL   Final   Organism ID, Bacteria KLEBSIELLA SPECIES   Final  URINE CULTURE     Status: None   Collection Time    03/11/14  9:54 AM      Result Value Ref Range Status   Specimen Description URINE, CLEAN CATCH   Final   Special Requests NONE   Final   Culture  Setup Time     Final   Value: 03/11/2014 14:19     Performed at [facility]   Colony Count     Final   Value: NO GROWTH     Performed at [facility]   Culture     Final   Value: NO GROWTH     Performed at [facility]   Report Status 03/12/2014 FINAL   Final  CULTURE, BLOOD (ROUTINE X 2)     Status: None   Collection Time    03/11/14 11:10 AM      Result Value Ref Range Status   Specimen Description BLOOD LEFT ARM   Final   Special Requests BOTTLES DRAWN AEROBIC ONLY 7.5CC   Final  Culture  Setup Time     Final   Value: 03/11/2014 16:59     Performed at [facility]   Culture     Final   Value: NO GROWTH 5 DAYS     Performed at [facility]   Report  Status 04/02/2014 FINAL   Final  CULTURE, BLOOD (ROUTINE X 2)     Status: None   Collection Time    03/11/14 11:15 AM      Result Value Ref Range Status   Specimen Description BLOOD LEFT HAND   Final   Special Requests BOTTLES DRAWN AEROBIC AND ANAEROBIC 10CC   Final   Culture  Setup Time     Final   Value: 03/11/2014 16:59     Performed at [facility]   Culture     Final   Value: NO GROWTH 5 DAYS     Performed at [facility]   Report Status 03/23/2014 FINAL   Final    Coagulation Studies:  Recent Labs  03/16/14 1000  LABPROT 16.4*  INR 1.32    Imaging: Dg Chest Port 1 View  03/16/2014   CLINICAL DATA:  Intubation.  EXAM: PORTABLE CHEST - 1 VIEW  COMPARISON:  03/15/2014  FINDINGS: Port-A-Cath, right IJ line, NG tube, endotracheal tube in stable position. Persistent cardiomegaly. Pulmonary vascularity is normal. Bibasilar infiltrates particularly on the right noted. Right pleural effusion is present and has increased in size from prior exam. No pneumothorax. No acute osseous abnormality .  IMPRESSION: 1. Line and tube positions stable. 2. Bibasilar infiltrates particularly on the right remain. Right pleural effusion is present. This has increased in size from prior exam. 3. Stable cardiomegaly. A component of congestive heart failure cannot be excluded.   Electronically Signed   By: Maisie Fushomas  Register   On: 03/16/2014 07:30    Medications:  I have reviewed the patient's current medications. Scheduled: . antiseptic oral rinse  15 mL Mouth Rinse QID  . aspirin  81 mg Per Tube Daily  . atorvastatin  80 mg Per NG tube q1800  . captopril  6.25 mg Per NG tube TID  . chlorhexidine  15 mL Mouth Rinse BID  . fentaNYL  50 mcg Intravenous Once  . furosemide  40 mg Intravenous BID  . insulin aspart  0-15 Units Subcutaneous 6 times per day  . ipratropium-albuterol  3 mL Nebulization Q6H  . levETIRAcetam  1,000 mg Per Tube BID  . metoprolol tartrate  12.5 mg Per Tube BID   . pantoprazole sodium  40 mg Per Tube Daily  . phenytoin (DILANTIN) IV  100 mg Intravenous 3 times per day  . piperacillin-tazobactam (ZOSYN)  IV  3.375 g Intravenous 3 times per day  . potassium chloride  20 mEq Per Tube Q4H  . sodium chloride  10-40 mL Intracatheter Q12H    Assessment/Plan: Patient unchanged.  Prognosis discussed with family on yesterday.  Plans for comfort care.   Recommendations: 1.  No further neurologic intervention is recommended at this time.  If further questions arise, please call or page at that time.  Thank you for allowing neurology to participate in the care of this patient.  Case discussed with Dr. Tyson AliasFeinstein    LOS: 13 days   Thana FarrLeslie Retal Tonkinson, MD [facility] [phone] 03/12/2014  8:35 AM

## 2014-04-11 NOTE — Progress Notes (Signed)
[facility] LAB REPLACEMENT ONLY  The [facility] does [facility] based on the criteria listed below:   1. Is GFR >/= 40 ml/min? Yes.    Patient's GFR today is >90 2. Is urine output >/= 0.5 ml/kg/hr for the last 6 hours? Yes.   Patient's UOP is 0.9 ml/kg/hr 3. Is BUN < 60 mg/dL? Yes.    Patient's BUN today is 33 4. Abnormal electrolyte(s): K+3.7 5. Ordered repletion with: protocol 6. If a panic level lab has been reported, has the CCM MD in charge been notified? No..   Physician:  Wyline MoodE Deterding  Rutherford Alarie Hilliard 03/28/2014 5:11 AM

## 2014-04-11 NOTE — Progress Notes (Signed)
Patient ID: Kyle [name], male   DOB: 06/10/1941, 73 y.o.   MRN: [id number] Extensive discussion with family wife, sons. We discussed the poor prognosis and likely poor quality of life. Family has decided to offer full comfort care. They are aware that the patient may be transferred to palliative care floor for continued comfort care needs. They have been fully updated on the process and expectations.  Mcarthur Rossettianiel J. Tyson AliasFeinstein, MD, FACP Pgr: [phone] [facility]

## 2014-04-11 NOTE — Progress Notes (Signed)
Chaplain responded to EOL, however, family had appropriate pastoral representation.  Chaplain educated family on how to reach a chaplain should they desire in the future.

## 2014-04-11 NOTE — Procedures (Signed)
Extubation Procedure Note  Patient Details:   Name: Kyle [name] DOB: 06/06/1941 MRN: [id number]   Airway Documentation:  Airway (Active)  Secured at (cm) 23 cm 03/22/2014  7:40 AM  Measured From Lips 03/21/2014  7:40 AM  Secured Location Right 03/23/2014  7:40 AM  Secured By [employer] Tube Holder 04/06/2014  7:40 AM  Tube Holder Repositioned Yes 04/03/2014  7:40 AM  Cuff Pressure (cm H2O) 26 cm H2O 04/05/2014 12:02 AM  Site Condition Dry 04/07/2014  7:40 AM     Airway 7.5 mm (Active)  Secured at (cm) 23 cm 03/15/2014  4:09 AM  Measured From Lips 03/15/2014  4:09 AM  Secured By [employer] Tube Holder 03/15/2014  4:09 AM  Tube Holder Repositioned Yes 03/15/2014  4:09 AM  Cuff Pressure (cm H2O) 24 cm H2O 03/15/2014  7:00 PM    Evaluation  O2 sats: stable throughout Complications: No apparent complications Patient did tolerate procedure well. Bilateral Breath Sounds: Diminished;Expiratory wheezes Suctioning: Oral;Airway No Extubated PT to room air. Extubation was per end of life protocol  [name], Duane LopeJeffrey D 03/21/2014, 10:26 AM

## 2014-04-11 NOTE — Progress Notes (Signed)
SUBJECTIVE:  [age] y/o male with previous CVA admitted witnessed OOH arrest requiring CPR and cooling. 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 His anatomy is not favorable for PCI. He will need consideration for CABG if he recovers neurological function. LVEF is 25% by LV gram. Echo with LVEF=30-35%.   Remains Intubated. Opens eyes but no purposeful response.   VT arrest on 6/28.  Shocked with 150J and quickly returned to his baseline AF.      Tele: afib with controlled ventricular response  BP 121/60  Pulse 75  Temp(Src) 99.3 F (37.4 C) (Oral)  Resp 14  Ht 6' (1.829 m)  Wt 195 lb 5.2 oz (88.6 kg)  BMI 26.49 kg/m2  SpO2 97%  Intake/Output Summary (Last 24 hours) at 04/01/2014 0833 Last data filed at 03/28/2014 0700  Gross per 24 hour  Intake 2377.41 ml  Output   3825 ml  Net -1447.59 ml    PHYSICAL EXAM General: Intubated. Opens eyes, no purposeful movements Neck: supple Lungs: diffuse rhonchi Heart: Irregular  Abdomen: Soft, not distended Extremities: 2+ edema  LABS: Basic Metabolic Panel:  Recent Labs  40/98/1106/02/23 0310 03/30/2014 0330  NA 140 140  K 4.0 3.7  CL 103 103  CO2 27 26  GLUCOSE 117* 114*  BUN 36* 33*  CREATININE 0.72 0.64  CALCIUM 8.6 7.8*   CBC:  Recent Labs  03/16/14 0310 04/03/2014 0330  WBC 5.5 4.8  NEUTROABS  --  4.0  HGB 9.2* 8.8*  HCT 27.8* 26.9*  MCV 97.9 97.1  PLT 141* 160  Current Meds: . antiseptic oral rinse  15 mL Mouth Rinse QID  . aspirin  81 mg Per Tube Daily  . atorvastatin  80 mg Per NG tube q1800  . captopril  6.25 mg Per NG tube TID  . chlorhexidine  15 mL Mouth Rinse BID  . fentaNYL  50 mcg Intravenous Once  . furosemide  40 mg Intravenous BID  . insulin aspart  0-15 Units Subcutaneous 6 times per day  . ipratropium-albuterol  3 mL Nebulization Q6H  . levETIRAcetam  1,000 mg Per Tube BID  . metoprolol tartrate  12.5 mg Per Tube BID  . pantoprazole sodium  40 mg Per Tube Daily  . phenytoin (DILANTIN) IV   100 mg Intravenous 3 times per day  . piperacillin-tazobactam (ZOSYN)  IV  3.375 g Intravenous 3 times per day  . potassium chloride  20 mEq Per Tube Q4H  . sodium chloride  10-40 mL Intracatheter Q12H   Cardiac cath 03/05/14:  Left mainstem: There is diffuse calcification with 40% ostial left main disease. The left main tapers distally to 30%.  Left anterior descending (LAD): The LAD is a large vessel that gives rise to a moderately large diagonal branch. In the proximal LAD there is an aneurysmal segment followed immediately by a severe bifurcation stenosis 1:1:1 in the LAD and first diagonal up to 95%.  There is a moderately large ramus branch without significant disease.  Left circumflex (LCx): The LCx has mild ostial disease but otherwise has no significant stenosis.  Right coronary artery (RCA): Dominant vessel with 70% proximal stenosis. The crux of the RCA is severely diseased up to 90%. The Distal RCA is occluded with right to right and left to right collaterals.  Left ventriculography: Left ventricular systolic function is abnormal, LV size is increased with extensive inferior akinesis and global hypokinesis. LVEF is estimated at 25%, there is mild to moderate mitral regurgitation  Echo 03/05/14: Left ventricle: The cavity size was moderately dilated. Systolic function was moderately to severely reduced. The estimated ejection fraction was in the range of 30% to 35%. There is akinesis of the entireinferior myocardium. There is akinesis of the basal-midinferolateral myocardium. - Aortic valve: Moderate thickening and calcification, consistent with sclerosis. There was trivial regurgitation. - Ascending aorta: The ascending aorta was mildly dilated. - Left atrium: The atrium was mildly dilated.  ASSESSMENT AND PLAN:   1. Ventricular fibrillation arrest, out of hospital: Witnessed arrest at home. Likely primary arrythmia event with ischemic cardiomyopathy. Bystander CPR. Admitted by  CCM. Pt found to have high grade CAD with complex disease involving the LAD/Diagonal bifurcation and severe disease in the RCA but these lesions were not acute. No PCI performed. S/p Cooling protocol per PCCM. Recurrent VT on 6/28. Shocked. Stopped amio due to bradycardia with seizure meds.  2. CAD: Severe double vessel disease by cath 03/05/14. His anatomy is not favorable for PCI. He would need consideration for CABG if he recovers neurological function.On ASA, b-blocker and statin. LVEF is 25% by LV gram. Echo with LVEF=30-35%.   Neurology reports that there is "Prognosis poor for functional recovery."  He is not a candidate for revascularization, either with PCI or CABG- unless his neuro status improves significantly.    3. Ischemic cardiomyopathy: As above. Will follow for now. Continue beta blocker and ACEI. Volume overloaded on exam; continue lasix 40 mg IV BID.  4. Atrial fib/AFL: rate controlled. Continue metoprolol. Continue heparin.    5. Anoxic brain injury:  Per neurology    6. H/o CVA  7. Fever: antibiotics per CCM Dr Tyson AliasFeinstein has discussed patient's condition with family; apparently for extubation today with no reintubation. Patient is NCB.   Olga MillersBrian Crenshaw MD  7/7/20158:33 AM

## 2014-04-11 DEATH — deceased

## 2014-08-20 ENCOUNTER — Encounter (HOSPITAL_COMMUNITY): Payer: Self-pay | Admitting: Cardiology

## 2016-02-13 IMAGING — CR DG CHEST 1V PORT
1 series · 1 of 1 positions shown · non-contrast
Comparison: One-view chest 03/14/2014.

CLINICAL DATA: Respiratory failure.

EXAM:
PORTABLE CHEST - 1 VIEW

[portable]
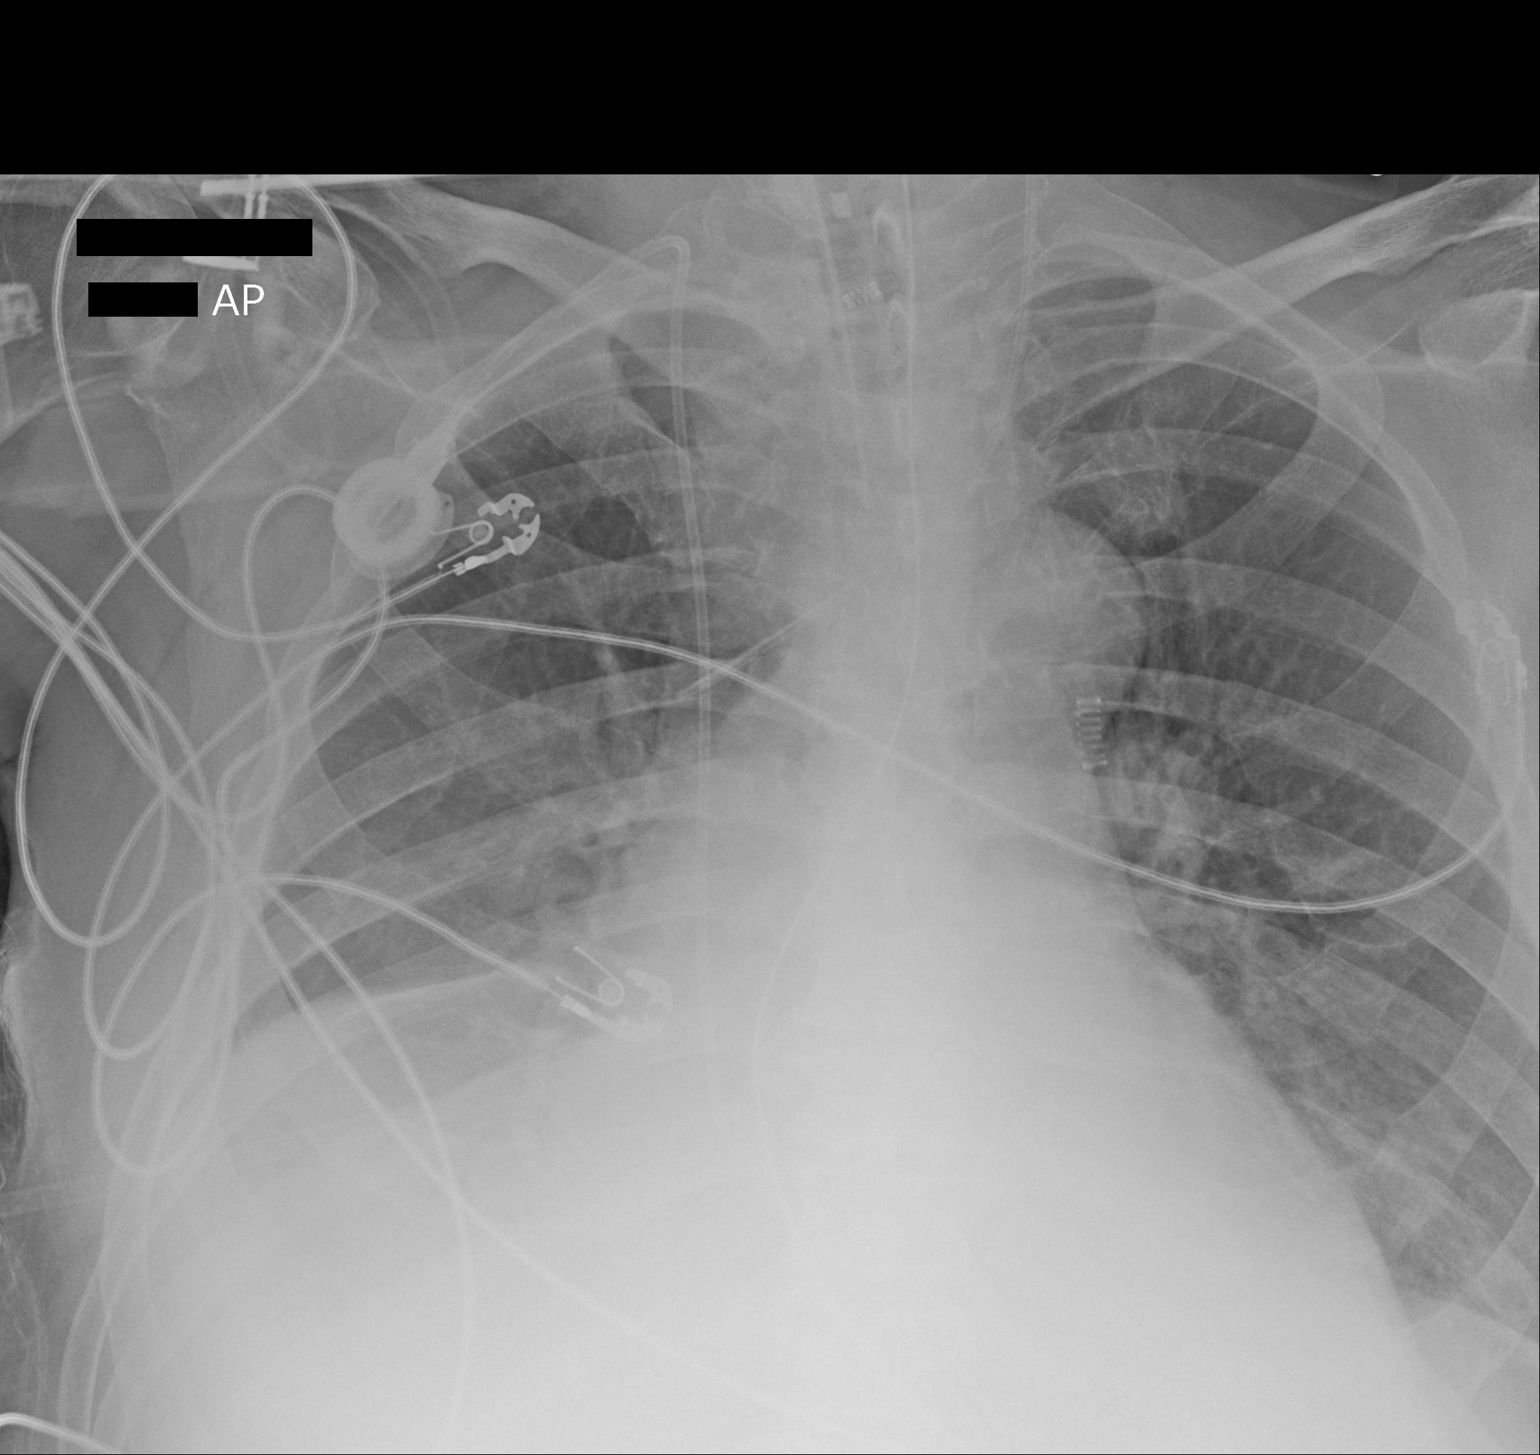

[1 of 1 positions shown; findings below may reference images not displayed]

FINDINGS: The endotracheal tube terminates 4.9 cm above the chronic, in
satisfactory position. A right IJ Port-A-Cath is stable in position.
The tip is greater than 2 vertebral bodies below the carina and may
be within the right atrium, unchanged. A left IJ line is stable in
position. The heart is enlarged. A right pleural effusion is
present. The left CP angle is not visualized. Right lower lobe
airspace disease is noted. Mild edema is present.
IMPRESSION: 1. Persistent right pleural effusion and basilar airspace disease.
While this likely represents atelectasis, infection is not excluded.
2. Increasing mild edema.
3. The support apparatus is stable.
